# Patient Record
Sex: Male | Born: 1970 | Race: Black or African American | Hispanic: No | Smoking: Current every day smoker
Health system: Southern US, Community
[De-identification: ages and names within clinical notes are randomized; demographics above are authoritative.]

## PROBLEM LIST (undated history)

## (undated) DIAGNOSIS — F32A Depression, unspecified: Secondary | ICD-10-CM

## (undated) DIAGNOSIS — I10 Essential (primary) hypertension: Secondary | ICD-10-CM

## (undated) DIAGNOSIS — F419 Anxiety disorder, unspecified: Secondary | ICD-10-CM

## (undated) HISTORY — PX: FRACTURE SURGERY: SHX138

---

## 2003-08-12 ENCOUNTER — Emergency Department (HOSPITAL_COMMUNITY): Admission: EM | Admit: 2003-08-12 | Discharge: 2003-08-12 | Payer: Self-pay | Admitting: Emergency Medicine

## 2003-12-04 ENCOUNTER — Emergency Department (HOSPITAL_COMMUNITY): Admission: EM | Admit: 2003-12-04 | Discharge: 2003-12-05 | Payer: Self-pay | Admitting: Emergency Medicine

## 2003-12-06 ENCOUNTER — Emergency Department (HOSPITAL_COMMUNITY): Admission: EM | Admit: 2003-12-06 | Discharge: 2003-12-06 | Payer: Self-pay | Admitting: Family Medicine

## 2004-03-28 ENCOUNTER — Emergency Department (HOSPITAL_COMMUNITY): Admission: EM | Admit: 2004-03-28 | Discharge: 2004-03-28 | Payer: Self-pay | Admitting: Family Medicine

## 2004-11-08 ENCOUNTER — Emergency Department (HOSPITAL_COMMUNITY): Admission: EM | Admit: 2004-11-08 | Discharge: 2004-11-08 | Payer: Self-pay | Admitting: Emergency Medicine

## 2004-11-13 ENCOUNTER — Emergency Department: Payer: Self-pay | Admitting: Emergency Medicine

## 2005-10-04 ENCOUNTER — Emergency Department (HOSPITAL_COMMUNITY): Admission: EM | Admit: 2005-10-04 | Discharge: 2005-10-04 | Payer: Self-pay | Admitting: Family Medicine

## 2005-12-14 ENCOUNTER — Emergency Department (HOSPITAL_COMMUNITY): Admission: EM | Admit: 2005-12-14 | Discharge: 2005-12-14 | Payer: Self-pay | Admitting: Emergency Medicine

## 2005-12-22 ENCOUNTER — Emergency Department (HOSPITAL_COMMUNITY): Admission: EM | Admit: 2005-12-22 | Discharge: 2005-12-22 | Payer: Self-pay | Admitting: Emergency Medicine

## 2006-01-02 ENCOUNTER — Ambulatory Visit: Payer: Self-pay | Admitting: Internal Medicine

## 2006-01-16 ENCOUNTER — Ambulatory Visit: Payer: Self-pay | Admitting: Internal Medicine

## 2006-02-28 ENCOUNTER — Ambulatory Visit: Payer: Self-pay | Admitting: Internal Medicine

## 2008-07-09 ENCOUNTER — Emergency Department (HOSPITAL_COMMUNITY): Admission: EM | Admit: 2008-07-09 | Discharge: 2008-07-09 | Payer: Self-pay | Admitting: Family Medicine

## 2008-10-27 ENCOUNTER — Emergency Department (HOSPITAL_COMMUNITY): Admission: EM | Admit: 2008-10-27 | Discharge: 2008-10-27 | Payer: Self-pay | Admitting: Emergency Medicine

## 2009-02-01 ENCOUNTER — Emergency Department (HOSPITAL_COMMUNITY): Admission: EM | Admit: 2009-02-01 | Discharge: 2009-02-01 | Payer: Self-pay | Admitting: Emergency Medicine

## 2010-04-12 LAB — GC/CHLAMYDIA PROBE AMP, GENITAL
Chlamydia, DNA Probe: NEGATIVE
GC Probe Amp, Genital: NEGATIVE

## 2020-04-06 ENCOUNTER — Emergency Department
Admission: EM | Admit: 2020-04-06 | Discharge: 2020-04-06 | Disposition: A | Discharge status: Home / Self Care | Payer: Self-pay | Attending: Emergency Medicine | Admitting: Emergency Medicine

## 2020-04-06 ENCOUNTER — Emergency Department: Payer: Self-pay

## 2020-04-06 ENCOUNTER — Other Ambulatory Visit: Payer: Self-pay

## 2020-04-06 ENCOUNTER — Encounter: Payer: Self-pay | Admitting: Emergency Medicine

## 2020-04-06 DIAGNOSIS — R42 Dizziness and giddiness: Secondary | ICD-10-CM | POA: Insufficient documentation

## 2020-04-06 DIAGNOSIS — F1910 Other psychoactive substance abuse, uncomplicated: Secondary | ICD-10-CM | POA: Insufficient documentation

## 2020-04-06 DIAGNOSIS — R55 Syncope and collapse: Secondary | ICD-10-CM | POA: Insufficient documentation

## 2020-04-06 DIAGNOSIS — F17210 Nicotine dependence, cigarettes, uncomplicated: Secondary | ICD-10-CM | POA: Insufficient documentation

## 2020-04-06 LAB — URINALYSIS, COMPLETE (UACMP) WITH MICROSCOPIC
Bacteria, UA: NONE SEEN
Bilirubin Urine: NEGATIVE
Glucose, UA: NEGATIVE mg/dL
Ketones, ur: 5 mg/dL — AB
Leukocytes,Ua: NEGATIVE
Nitrite: NEGATIVE
Protein, ur: NEGATIVE mg/dL
Specific Gravity, Urine: 1.009 (ref 1.005–1.030)
Squamous Epithelial / LPF: NONE SEEN (ref 0–5)
pH: 7 (ref 5.0–8.0)

## 2020-04-06 LAB — URINE DRUG SCREEN, QUALITATIVE (ARMC ONLY)
Amphetamines, Ur Screen: NOT DETECTED
Barbiturates, Ur Screen: NOT DETECTED
Benzodiazepine, Ur Scrn: NOT DETECTED
Cannabinoid 50 Ng, Ur ~~LOC~~: POSITIVE — AB
Cocaine Metabolite,Ur ~~LOC~~: POSITIVE — AB
MDMA (Ecstasy)Ur Screen: NOT DETECTED
Methadone Scn, Ur: NOT DETECTED
Opiate, Ur Screen: NOT DETECTED
Phencyclidine (PCP) Ur S: NOT DETECTED
Tricyclic, Ur Screen: NOT DETECTED

## 2020-04-06 LAB — TROPONIN I (HIGH SENSITIVITY): Troponin I (High Sensitivity): 4 ng/L (ref <18)

## 2020-04-06 LAB — CBC
HCT: 39.8 % (ref 39.0–52.0)
Hemoglobin: 13.7 g/dL (ref 13.0–17.0)
MCH: 30.4 pg (ref 26.0–34.0)
MCHC: 34.4 g/dL (ref 30.0–36.0)
MCV: 88.4 fL (ref 80.0–100.0)
Platelets: 348 10*3/uL (ref 150–400)
RBC: 4.5 MIL/uL (ref 4.22–5.81)
RDW: 13.5 % (ref 11.5–15.5)
WBC: 9 10*3/uL (ref 4.0–10.5)
nRBC: 0 % (ref 0.0–0.2)

## 2020-04-06 LAB — ETHANOL: Alcohol, Ethyl (B): <10 mg/dL (ref <10)

## 2020-04-06 LAB — BASIC METABOLIC PANEL
Anion gap: 12 (ref 5–15)
BUN: 14 mg/dL (ref 6–20)
CO2: 23 mmol/L (ref 22–32)
Calcium: 9.3 mg/dL (ref 8.9–10.3)
Chloride: 98 mmol/L (ref 98–111)
Creatinine, Ser: 1.08 mg/dL (ref 0.61–1.24)
GFR, Estimated: >60 mL/min (ref >60)
Glucose, Bld: 110 mg/dL — ABNORMAL HIGH (ref 70–99)
Potassium: 3.2 mmol/L — ABNORMAL LOW (ref 3.5–5.1)
Sodium: 133 mmol/L — ABNORMAL LOW (ref 135–145)

## 2020-04-06 LAB — CBG MONITORING, ED: Glucose-Capillary: 109 mg/dL — ABNORMAL HIGH (ref 70–99)

## 2020-04-06 MED ORDER — LACTATED RINGERS IV BOLUS
1000.0000 mL | Freq: Once | INTRAVENOUS | Status: AC
  Administered 2020-04-06: 1000 mL via INTRAVENOUS

## 2020-04-06 MED ORDER — POTASSIUM CHLORIDE CRYS ER 20 MEQ PO TBCR
40.0000 meq | EXTENDED_RELEASE_TABLET | Freq: Once | ORAL | Status: AC
  Administered 2020-04-06: 40 meq via ORAL
  Filled 2020-04-06: qty 2

## 2020-04-06 NOTE — ED Notes (Signed)
Pt ambulatory to bathroom with his son at his side

## 2020-04-06 NOTE — ED Notes (Signed)
MD at bedside for eval.

## 2020-04-06 NOTE — ED Provider Notes (Signed)
[facility] Emergency Department Provider Note   ____________________________________________   Event Date/Time   First MD Initiated Contact with Patient 04/06/20 1559     (approximate)  I have reviewed the triage vital signs and the nursing notes.   HISTORY  Chief Complaint Syncope   HPI Miguel Meadows is a 50 y.o. male with no significant past medical history who presents to the ED complaining of syncope.  Patient reports that he passed out 3 times back to back while at his friend's house.  He states he was feeling very lightheaded and then eventually fell to the ground, losing consciousness for a few seconds.  He is concerned that he hit his head as he now complains of headache and pain in his neck.  He denies any pain in his extremities, chest, or abdomen.  He states he has never had syncopal episodes like this before, denies any chest pain or shortness of breath with these episodes.  He had been feeling well recently with no fevers, cough, vomiting, diarrhea.  He does admit to significant alcohol and drug use in the past month and a half since his wife passed away.  He states he has been drinking 3-4 beers along with liquor on a daily basis with his last drink coming earlier today.  He also admits to marijuana and crack cocaine use, denies opiate use or IV drug use.  He denies any suicidal or homicidal ideation.        History reviewed. No pertinent past medical history.  There are no problems to display for this patient.   History reviewed. No pertinent surgical history.  Prior to Admission medications   Not on File    Allergies Patient has no allergy information on record.  History reviewed. No pertinent family history.  Social History Social History   Tobacco Use  . Smoking status: Current Every Day Smoker    Packs/day: 0.50    Types: Cigarettes    Review of Systems  Constitutional: No fever/chills Eyes: No visual  changes. ENT: No sore throat. Cardiovascular: Denies chest pain.  Positive for syncope. Respiratory: Denies shortness of breath. Gastrointestinal: No abdominal pain.  No nausea, no vomiting.  No diarrhea.  No constipation. Genitourinary: Negative for dysuria. Musculoskeletal: Negative for back pain.  Positive for neck pain. Skin: Negative for rash. Neurological: Positive for headaches, negative for focal weakness or numbness.  ____________________________________________   PHYSICAL EXAM:  VITAL SIGNS: ED Triage Vitals  Enc Vitals Group     BP 04/06/20 1526 131/77     Pulse Rate 04/06/20 1526 85     Resp 04/06/20 1526 19     Temp 04/06/20 1535 97.8 F (36.6 C)     Temp Source 04/06/20 1535 Oral     SpO2 04/06/20 1526 99 %     Weight 04/06/20 1536 170 lb (77.1 kg)     Height 04/06/20 1536 5\' 7"  (1.702 m)     Head Circumference --      Peak Flow --      Pain Score 04/06/20 1536 4     Pain Loc --      Pain Edu? --      Excl. in GC? --     Constitutional: Somnolent but arousable to voice, oriented to person, place, time, and situation.  Intoxicated appearing. Eyes: Conjunctivae are normal.  Pupils equal round and reactive to light bilaterally. Head: Atraumatic.  No scalp hematomas or step-offs. Nose: No congestion/rhinnorhea. Mouth/Throat: Mucous membranes  are moist. Neck: Normal ROM, midline cervical spine tenderness to palpation noted. Cardiovascular: Normal rate, regular rhythm. Grossly normal heart sounds. Respiratory: Normal respiratory effort.  No retractions. Lungs CTAB. Gastrointestinal: Soft and nontender. No distention. Genitourinary: deferred Musculoskeletal: No lower extremity tenderness nor edema. Neurologic:  Normal speech and language. No gross focal neurologic deficits are appreciated. Skin:  Skin is warm, dry and intact. No rash noted. Psychiatric: Mood and affect are normal. Speech and behavior are normal.  ____________________________________________    LABS (all labs ordered are listed, but only abnormal results are displayed)  Labs Reviewed  BASIC METABOLIC PANEL - Abnormal; Notable for the following components:      Result Value   Sodium 133 (*)    Potassium 3.2 (*)    Glucose, Bld 110 (*)    All other components within normal limits  URINALYSIS, COMPLETE (UACMP) WITH MICROSCOPIC - Abnormal; Notable for the following components:   Color, Urine YELLOW (*)    APPearance CLEAR (*)    Hgb urine dipstick SMALL (*)    Ketones, ur 5 (*)    All other components within normal limits  URINE DRUG SCREEN, QUALITATIVE ([facility] ONLY) - Abnormal; Notable for the following components:   Cocaine Metabolite,Ur [place] POSITIVE (*)    Cannabinoid 50 Ng, Ur  POSITIVE (*)    All other components within normal limits  CBG MONITORING, ED - Abnormal; Notable for the following components:   Glucose-Capillary 109 (*)    All other components within normal limits  CBC  ETHANOL  TROPONIN I (HIGH SENSITIVITY)   ____________________________________________  EKG  ED ECG REPORT I, Chesley Noon, the attending physician, personally viewed and interpreted this ECG.   Date: 04/06/2020  EKG Time: 15:52  Rate: 74  Rhythm: normal sinus rhythm, sinus arrhythmia  Axis: Normal  Intervals:none  ST&T Change: None   PROCEDURES  Procedure(s) performed (including Critical Care):  Procedures   ____________________________________________   INITIAL IMPRESSION / ASSESSMENT AND PLAN / ED COURSE       50 year old male with no significant past medical history presents to the ED after reporting 3 syncopal episodes in rapid succession at his friend's house today.  EKG shows no evidence of arrhythmia or ischemia and I have a low suspicion for cardiac etiology as patient denies any associated chest pain or shortness of breath.  We will observe on cardiac monitor and screening labs including troponin.  His recent binge drinking may be a factor and he has likely had  poor p.o. intake.  We will hydrate with IV fluids.  There was concerned that patient was having respiratory depression, although he currently has a normal respiratory rate with O2 sats in the mid 90s on room air.  He denies any opiate use and we will hold off on Narcan for now.  We will further assess for traumatic injury with CT head and C-spine.  CT head reviewed by me and shows no obvious hemorrhage, CT head and C-spine negative for acute process per radiology.  Labs are unremarkable, troponin within normal limits and I doubt cardiac etiology of his syncope.  Patient feeling much better following IV fluid hydration, son is now at bedside requesting information on detox.  Patient continues to decline inpatient detox, but is requesting outpatient resources.  He is appropriate for discharge home and was provided with referral to PCP for follow-up of syncopal episode, also provided with resources for substance abuse.  He was counseled to return to the ED for new or worsening  symptoms, patient agrees with plan.      ____________________________________________   FINAL CLINICAL IMPRESSION(S) / ED DIAGNOSES  Final diagnoses:  Syncope, unspecified syncope type  Polysubstance abuse [facility])     ED Discharge Orders    None       Note:  This document was prepared using Dragon voice recognition software and may include unintentional dictation errors.   Chesley Noon, MD 04/06/20 1911

## 2020-04-06 NOTE — ED Notes (Signed)
Patient transported to CT 

## 2020-04-06 NOTE — ED Notes (Signed)
Pt very drowsy in triage, pt responds to verbal stimulation, pt with sats of 96 on room air. Pt states he did crack and marijuana at 1 pm, today.

## 2020-04-06 NOTE — ED Triage Notes (Signed)
Pt comes into the ED via [facility] from a friends house for syncopal episode.  Pt does admit to his wife recently passed away and now he has been on a 1 month binge of crack, marijuana, and alcohol. Pt denies hitting his head, but did have episode of incontinence.  VSS with EMS. Pt A&Ox4. 18g Left forearm.

## 2020-04-06 NOTE — ED Notes (Signed)
Pt with irregular RR, sats sometimes decreasing to 88 % on room air very shortly, then increasing to 98-100% on room air.

## 2020-04-13 ENCOUNTER — Telehealth: Payer: Self-pay

## 2020-04-13 NOTE — Telephone Encounter (Signed)
After receiving an ER referral, called pt on mobile and home numbers. Was unable to leave message.   MD 04/13/20 @ 2:25 pm

## 2020-09-16 ENCOUNTER — Encounter (HOSPITAL_COMMUNITY): Payer: Self-pay

## 2020-09-16 ENCOUNTER — Emergency Department (HOSPITAL_COMMUNITY)
Admission: EM | Admit: 2020-09-16 | Discharge: 2020-09-17 | Disposition: A | Discharge status: Home / Self Care | Payer: Self-pay | Attending: Emergency Medicine | Admitting: Emergency Medicine

## 2020-09-16 ENCOUNTER — Other Ambulatory Visit: Payer: Self-pay

## 2020-09-16 DIAGNOSIS — F1910 Other psychoactive substance abuse, uncomplicated: Secondary | ICD-10-CM

## 2020-09-16 DIAGNOSIS — R45851 Suicidal ideations: Secondary | ICD-10-CM | POA: Insufficient documentation

## 2020-09-16 DIAGNOSIS — F17210 Nicotine dependence, cigarettes, uncomplicated: Secondary | ICD-10-CM | POA: Insufficient documentation

## 2020-09-16 DIAGNOSIS — F1414 Cocaine abuse with cocaine-induced mood disorder: Secondary | ICD-10-CM | POA: Insufficient documentation

## 2020-09-16 DIAGNOSIS — F1924 Other psychoactive substance dependence with psychoactive substance-induced mood disorder: Secondary | ICD-10-CM | POA: Insufficient documentation

## 2020-09-16 DIAGNOSIS — Y906 Blood alcohol level of 120-199 mg/100 ml: Secondary | ICD-10-CM | POA: Insufficient documentation

## 2020-09-16 DIAGNOSIS — F1994 Other psychoactive substance use, unspecified with psychoactive substance-induced mood disorder: Secondary | ICD-10-CM | POA: Diagnosis present

## 2020-09-16 DIAGNOSIS — Z20822 Contact with and (suspected) exposure to covid-19: Secondary | ICD-10-CM | POA: Insufficient documentation

## 2020-09-16 DIAGNOSIS — F332 Major depressive disorder, recurrent severe without psychotic features: Secondary | ICD-10-CM | POA: Insufficient documentation

## 2020-09-16 LAB — COMPREHENSIVE METABOLIC PANEL
ALT: 16 U/L (ref 0–44)
AST: 20 U/L (ref 15–41)
Albumin: 3.9 g/dL (ref 3.5–5.0)
Alkaline Phosphatase: 44 U/L (ref 38–126)
Anion gap: 10 (ref 5–15)
BUN: 9 mg/dL (ref 6–20)
CO2: 23 mmol/L (ref 22–32)
Calcium: 9.4 mg/dL (ref 8.9–10.3)
Chloride: 107 mmol/L (ref 98–111)
Creatinine, Ser: 0.9 mg/dL (ref 0.61–1.24)
GFR, Estimated: >60 mL/min (ref >60)
Glucose, Bld: 105 mg/dL — ABNORMAL HIGH (ref 70–99)
Potassium: 4 mmol/L (ref 3.5–5.1)
Sodium: 140 mmol/L (ref 135–145)
Total Bilirubin: 0.7 mg/dL (ref 0.3–1.2)
Total Protein: 7.3 g/dL (ref 6.5–8.1)

## 2020-09-16 LAB — CBC WITH DIFFERENTIAL/PLATELET
Abs Immature Granulocytes: 0.02 10*3/uL (ref 0.00–0.07)
Basophils Absolute: 0 10*3/uL (ref 0.0–0.1)
Basophils Relative: 0 %
Eosinophils Absolute: 0.1 10*3/uL (ref 0.0–0.5)
Eosinophils Relative: 2 %
HCT: 45.3 % (ref 39.0–52.0)
Hemoglobin: 14.9 g/dL (ref 13.0–17.0)
Immature Granulocytes: 0 %
Lymphocytes Relative: 34 %
Lymphs Abs: 2.4 10*3/uL (ref 0.7–4.0)
MCH: 30.7 pg (ref 26.0–34.0)
MCHC: 32.9 g/dL (ref 30.0–36.0)
MCV: 93.2 fL (ref 80.0–100.0)
Monocytes Absolute: 0.6 10*3/uL (ref 0.1–1.0)
Monocytes Relative: 8 %
Neutro Abs: 4 10*3/uL (ref 1.7–7.7)
Neutrophils Relative %: 56 %
Platelets: 311 10*3/uL (ref 150–400)
RBC: 4.86 MIL/uL (ref 4.22–5.81)
RDW: 14.5 % (ref 11.5–15.5)
WBC: 7.1 10*3/uL (ref 4.0–10.5)
nRBC: 0 % (ref 0.0–0.2)

## 2020-09-16 LAB — RAPID URINE DRUG SCREEN, HOSP PERFORMED
Amphetamines: NOT DETECTED
Barbiturates: NOT DETECTED
Benzodiazepines: NOT DETECTED
Cocaine: POSITIVE — AB
Opiates: NOT DETECTED
Tetrahydrocannabinol: NOT DETECTED

## 2020-09-16 LAB — RESP PANEL BY RT-PCR (FLU A&B, COVID) ARPGX2
Influenza A by PCR: NEGATIVE
Influenza B by PCR: NEGATIVE
SARS Coronavirus 2 by RT PCR: NEGATIVE

## 2020-09-16 LAB — ACETAMINOPHEN LEVEL: Acetaminophen (Tylenol), Serum: <10 ug/mL — ABNORMAL LOW (ref 10–30)

## 2020-09-16 LAB — SALICYLATE LEVEL: Salicylate Lvl: <7 mg/dL — ABNORMAL LOW (ref 7.0–30.0)

## 2020-09-16 LAB — ETHANOL: Alcohol, Ethyl (B): 125 mg/dL — ABNORMAL HIGH (ref <10)

## 2020-09-16 MED ORDER — THIAMINE HCL 100 MG PO TABS
100.0000 mg | ORAL_TABLET | Freq: Every day | ORAL | Status: DC
  Administered 2020-09-16 – 2020-09-17 (×2): 100 mg via ORAL
  Filled 2020-09-16 (×2): qty 1

## 2020-09-16 MED ORDER — LORAZEPAM 1 MG PO TABS
0.0000 mg | ORAL_TABLET | Freq: Four times a day (QID) | ORAL | Status: DC
  Administered 2020-09-16 – 2020-09-17 (×2): 1 mg via ORAL
  Administered 2020-09-17: 2 mg via ORAL
  Filled 2020-09-16 (×2): qty 1
  Filled 2020-09-16: qty 2

## 2020-09-16 MED ORDER — LORAZEPAM 1 MG PO TABS: 0.0000 mg | ORAL_TABLET | Freq: Two times a day (BID) | ORAL | Status: DC

## 2020-09-16 MED ORDER — LORAZEPAM 2 MG/ML IJ SOLN
0.0000 mg | Freq: Four times a day (QID) | INTRAMUSCULAR | Status: DC
Start: 2020-09-16 — End: 2020-09-17

## 2020-09-16 MED ORDER — LORAZEPAM 2 MG/ML IJ SOLN: 0.0000 mg | Freq: Two times a day (BID) | INTRAMUSCULAR | Status: DC

## 2020-09-16 MED ORDER — THIAMINE HCL 100 MG/ML IJ SOLN: 100.0000 mg | Freq: Every day | INTRAMUSCULAR | Status: DC

## 2020-09-16 MED ORDER — NICOTINE 21 MG/24HR TD PT24
21.0000 mg | MEDICATED_PATCH | Freq: Every day | TRANSDERMAL | Status: DC | PRN
Start: 2020-09-16 — End: 2020-09-17

## 2020-09-16 NOTE — ED Notes (Addendum)
Pt belonging have been placed in [facility].

## 2020-09-16 NOTE — ED Provider Notes (Signed)
[facility]-EMERGENCY DEPT Provider Note   CSN: [id number] Arrival date & time: 09/16/20  1542     History Chief Complaint  Patient presents with   Suicidal   Addiction Problem    Miguel Meadows is a 50 y.o. male with a past medical history significant for depression who presents to the ED due to SI.  Patient states he had thoughts of killing himself last night where he planned to overdose.  Patient states his wife recently passed away 6 months ago and has been having depressive thoughts ever since.  Patient admits to HI where he states he wants to hurt people that have hurt him.  Notes he does not want hurt anyone here in the hospital.  He admits to drinking alcohol daily.  He states he shares a half a gallon with a few people daily.  Drank alcohol prior to arrival.  He also admits to methamphetamines, opioids, and crack use.  He mitts to some IV drug use.  Last used 1 month ago.  Denies any hallucinations.  He notes he sometimes has hallucinations when he is "high".  No physical complaints.  No treatment prior to arrival.  History obtained from patient and past medical records. No interpreter used during encounter.       History reviewed. No pertinent past medical history.  There are no problems to display for this patient.   History reviewed. No pertinent surgical history.     History reviewed. No pertinent family history.  Social History   Tobacco Use   Smoking status: Every Day    Packs/day: 0.50    Types: Cigarettes  Substance Use Topics   Alcohol use: Yes   Drug use: Yes    Types: IV, Cocaine, Marijuana, Methamphetamines    Home Medications Prior to Admission medications   Not on File    Allergies    Patient has no known allergies.  Review of Systems   Review of Systems  Constitutional:  Negative for chills and fever.  HENT:  Negative for rhinorrhea and sore throat.   Eyes:  Negative for visual disturbance.  Respiratory:   Negative for shortness of breath.   Cardiovascular:  Negative for chest pain and palpitations.  Gastrointestinal:  Negative for abdominal pain.  Genitourinary:  Negative for dysuria.  Musculoskeletal:  Negative for myalgias.  Skin:  Negative for color change and rash.  Neurological:  Negative for dizziness and light-headedness.  Psychiatric/Behavioral:  Positive for self-injury and suicidal ideas.    Physical Exam Updated Vital Signs BP 119/89 (BP Location: Left Arm)   Pulse 93   Temp 98.7 F (37.1 C) (Oral)   Resp 19   SpO2 98%   Physical Exam Vitals and nursing note reviewed.  Constitutional:      General: He is not in acute distress.    Appearance: He is not ill-appearing.  HENT:     Head: Normocephalic.  Eyes:     Pupils: Pupils are equal, round, and reactive to light.  Cardiovascular:     Rate and Rhythm: Normal rate and regular rhythm.     Pulses: Normal pulses.     Heart sounds: Normal heart sounds. No murmur heard.   No friction rub. No gallop.  Pulmonary:     Effort: Pulmonary effort is normal.     Breath sounds: Normal breath sounds.  Abdominal:     General: Abdomen is flat. There is no distension.     Palpations: Abdomen is soft.  Tenderness: There is no abdominal tenderness. There is no guarding or rebound.  Musculoskeletal:        General: Normal range of motion.     Cervical back: Neck supple.  Skin:    General: Skin is warm and dry.  Neurological:     General: No focal deficit present.     Mental Status: He is alert.  Psychiatric:        Mood and Affect: Mood is depressed.        Behavior: Behavior normal.        Thought Content: Thought content includes suicidal ideation. Thought content includes suicidal plan.    ED Results / Procedures / Treatments   Labs (all labs ordered are listed, but only abnormal results are displayed) Labs Reviewed  COMPREHENSIVE METABOLIC PANEL - Abnormal; Notable for the following components:      Result Value    Glucose, Bld 105 (*)    All other components within normal limits  ETHANOL - Abnormal; Notable for the following components:   Alcohol, Ethyl (B) 125 (*)    All other components within normal limits  RAPID URINE DRUG SCREEN, HOSP PERFORMED - Abnormal; Notable for the following components:   Cocaine POSITIVE (*)    All other components within normal limits  ACETAMINOPHEN LEVEL - Abnormal; Notable for the following components:   Acetaminophen (Tylenol), Serum <10 (*)    All other components within normal limits  SALICYLATE LEVEL - Abnormal; Notable for the following components:   Salicylate Lvl <7.0 (*)    All other components within normal limits  RESP PANEL BY RT-PCR (FLU A&B, COVID) ARPGX2  CBC WITH DIFFERENTIAL/PLATELET    EKG None  Radiology No results found.  Procedures Procedures   Medications Ordered in ED Medications  LORazepam (ATIVAN) injection 0-4 mg ( Intravenous See Alternative 09/16/20 1709)    Or  LORazepam (ATIVAN) tablet 0-4 mg (1 mg Oral Given 09/16/20 1709)  LORazepam (ATIVAN) injection 0-4 mg (has no administration in time range)    Or  LORazepam (ATIVAN) tablet 0-4 mg (has no administration in time range)  thiamine tablet 100 mg (100 mg Oral Given 09/16/20 1710)    Or  thiamine (B-1) injection 100 mg ( Intravenous See Alternative 09/16/20 1710)  nicotine (NICODERM CQ - dosed in mg/24 hours) patch 21 mg (has no administration in time range)    ED Course  I have reviewed the triage vital signs and the nursing notes.  Pertinent labs & imaging results that were available during my care of the patient were reviewed by me and considered in my medical decision making (see chart for details).  Clinical Course as of 09/16/20 1816  Sat Sep 16, 2020  1813 Alcohol, Ethyl (B)(!): 125 [CA]  1813 COCAINE(!): POSITIVE [CA]    Clinical Course User Index [CA] Audelia Acton Raina Mina   MDM Rules/Calculators/A&P                           50 year old male  presents to the ED due to suicidal ideations.  He had plans to overdose last night on his sister's pills.  Patient's wife recently passed away 6 months ago.  History of depression.  Upon arrival, vitals all within normal limits.  Patient is afebrile, not tachycardic or hypoxic.  Patient in no acute distress.  Benign physical exam.  Medical clearance labs ordered at triage.  CIWA in place.  No signs of withdrawal at this time.  Patient is currently here voluntarily; however, will need to be IVC'd if he attempts to leave.  CBC unremarkable no leukocytosis and normal hemoglobin.  Acetaminophen and salicylate level within normal limits.  Ethanol elevated at 125.  CMP reassuring with normal renal function no major electrolyte derangements.  COVID/influenza negative.  UDS positive for cocaine.  Patient has been medically cleared for TTS evaluation.  The patient has been placed in psychiatric observation due to the need to provide a safe environment for the patient while obtaining psychiatric consultation and evaluation, as well as ongoing medical and medication management to treat the patient's condition.  The patient has not been placed under full IVC at this time.  Final Clinical Impression(s) / ED Diagnoses Final diagnoses:  Suicidal ideation  Polysubstance abuse [facility])    Rx / DC Orders ED Discharge Orders     None        Mannie Stabile, PA-C 09/16/20 1816    Charlynne Pander, MD 09/16/20 [phone]

## 2020-09-16 NOTE — ED Notes (Addendum)
Pt brought with him a book bag with two pair of shoes, a duffle bag with drinks, snacks, several articles of clothes, a bottle of lotion. On his body he had a red hat, shirt, shorts, a pair of socks and a pair of shoe. Items were collected and place in white bags.

## 2020-09-16 NOTE — ED Triage Notes (Signed)
Pt reports he wants help with his drug use. Pt reports using crack, marijuana, heroin, and opiods. Pt also reports thoughts of suicide. States that he thought about overdosing on his sister's pills last night.

## 2020-09-16 NOTE — ED Notes (Signed)
Per Berna Spare:  Recommended for psychiatric inpatient tx per NP Ene Ajibola.
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
## 2020-09-16 NOTE — ED Provider Notes (Signed)
Emergency Medicine Provider Triage Evaluation Note  Miguel Meadows , a 50 y.o. male  was evaluated in triage.  Pt complains of requesting help for SI. He has been mostly using methamphetamines, opioids, crack. He drinks every day.  He wanted to take his sisters meds to kill him self last night.  He states he "is a coward."  His wife died about 6 months ago and he had been clean before that.  Since he states that his wife passed he has tried to kill him self slowly and that he hasn't wanted to live.   He also states "I don't want to be here and there are a few people I want to take with me."   He reports that he has access to a gun, he feels like people in his life have been trying to deliberately hurt him and if he is can go out he wants to make them feel pain before he leaves.  He states that he does not necessarily want to kill them, just wants to make him suffer.  He drinks alcohol every day.  He states he had alcohol prior to coming in today.  He did not tell me the names of any one in particular he wants to harm or give any clues to their identity.   Review of Systems  Positive: SI, HI, Depression Negative: Fevers  Physical Exam  BP 119/89 (BP Location: Left Arm)   Pulse 93   Temp 98.7 F (37.1 C) (Oral)   Resp 19   SpO2 98%  Gen:   Awake, no distress   Resp:  Normal effort  MSK:   Moves extremities without difficulty  Other:  Patient endorses SI, HI, feelings of being worthless and helpless.  Admit to IV drug use.  Medical Decision Making  Medically screening exam initiated at 4:15 PM.  Appropriate orders placed.  HOYLE BARKDULL was informed that the remainder of the evaluation will be completed by another provider, this initial triage assessment does not replace that evaluation, and the importance of remaining in the ED until their evaluation is complete.  Patient is currently voluntary however if he attempts to leave I would strongly recommend IVC.   Note: Portions of  this report may have been transcribed using voice recognition software. Every effort was made to ensure accuracy; however, inadvertent computerized transcription errors may be present    Norman Clay 09/16/20 1630    Kommor, Wyn Forster, MD 09/16/20 2250

## 2020-09-17 ENCOUNTER — Emergency Department (HOSPITAL_COMMUNITY): Admission: EM | Admit: 2020-09-17 | Discharge: 2020-09-17 | Discharge status: Against Medical Advice | Payer: Self-pay

## 2020-09-17 ENCOUNTER — Encounter: Payer: Self-pay | Admitting: Emergency Medicine

## 2020-09-17 ENCOUNTER — Emergency Department
Admission: EM | Admit: 2020-09-17 | Discharge: 2020-09-18 | Disposition: A | Discharge status: Home / Self Care | Payer: Self-pay | Attending: Emergency Medicine | Admitting: Emergency Medicine

## 2020-09-17 ENCOUNTER — Other Ambulatory Visit: Payer: Self-pay

## 2020-09-17 DIAGNOSIS — F1414 Cocaine abuse with cocaine-induced mood disorder: Secondary | ICD-10-CM | POA: Diagnosis present

## 2020-09-17 DIAGNOSIS — F1994 Other psychoactive substance use, unspecified with psychoactive substance-induced mood disorder: Secondary | ICD-10-CM | POA: Diagnosis present

## 2020-09-17 DIAGNOSIS — F419 Anxiety disorder, unspecified: Secondary | ICD-10-CM | POA: Insufficient documentation

## 2020-09-17 DIAGNOSIS — F32A Depression, unspecified: Secondary | ICD-10-CM

## 2020-09-17 DIAGNOSIS — F1910 Other psychoactive substance abuse, uncomplicated: Secondary | ICD-10-CM | POA: Diagnosis present

## 2020-09-17 DIAGNOSIS — I10 Essential (primary) hypertension: Secondary | ICD-10-CM | POA: Insufficient documentation

## 2020-09-17 DIAGNOSIS — F17210 Nicotine dependence, cigarettes, uncomplicated: Secondary | ICD-10-CM | POA: Insufficient documentation

## 2020-09-17 DIAGNOSIS — F1494 Cocaine use, unspecified with cocaine-induced mood disorder: Secondary | ICD-10-CM | POA: Insufficient documentation

## 2020-09-17 DIAGNOSIS — R45851 Suicidal ideations: Secondary | ICD-10-CM | POA: Insufficient documentation

## 2020-09-17 HISTORY — DX: Depression, unspecified: F32.A

## 2020-09-17 HISTORY — DX: Essential (primary) hypertension: I10

## 2020-09-17 HISTORY — DX: Anxiety disorder, unspecified: F41.9

## 2020-09-17 LAB — CBC
HCT: 41.8 % (ref 39.0–52.0)
Hemoglobin: 14.5 g/dL (ref 13.0–17.0)
MCH: 32.2 pg (ref 26.0–34.0)
MCHC: 34.7 g/dL (ref 30.0–36.0)
MCV: 92.9 fL (ref 80.0–100.0)
Platelets: 311 10*3/uL (ref 150–400)
RBC: 4.5 MIL/uL (ref 4.22–5.81)
RDW: 14.4 % (ref 11.5–15.5)
WBC: 7.1 10*3/uL (ref 4.0–10.5)
nRBC: 0 % (ref 0.0–0.2)

## 2020-09-17 LAB — COMPREHENSIVE METABOLIC PANEL
ALT: 19 U/L (ref 0–44)
AST: 22 U/L (ref 15–41)
Albumin: 3.6 g/dL (ref 3.5–5.0)
Alkaline Phosphatase: 41 U/L (ref 38–126)
Anion gap: 8 (ref 5–15)
BUN: 14 mg/dL (ref 6–20)
CO2: 27 mmol/L (ref 22–32)
Calcium: 9.4 mg/dL (ref 8.9–10.3)
Chloride: 102 mmol/L (ref 98–111)
Creatinine, Ser: 1.39 mg/dL — ABNORMAL HIGH (ref 0.61–1.24)
GFR, Estimated: >60 mL/min (ref >60)
Glucose, Bld: 88 mg/dL (ref 70–99)
Potassium: 4.5 mmol/L (ref 3.5–5.1)
Sodium: 137 mmol/L (ref 135–145)
Total Bilirubin: 0.5 mg/dL (ref 0.3–1.2)
Total Protein: 6.9 g/dL (ref 6.5–8.1)

## 2020-09-17 LAB — ETHANOL: Alcohol, Ethyl (B): <10 mg/dL (ref <10)

## 2020-09-17 MED ORDER — THIAMINE HCL 100 MG/ML IJ SOLN: 100.0000 mg | Freq: Every day | INTRAMUSCULAR | Status: DC

## 2020-09-17 MED ORDER — LORAZEPAM 2 MG/ML IJ SOLN: 0.0000 mg | Freq: Two times a day (BID) | INTRAMUSCULAR | Status: DC

## 2020-09-17 MED ORDER — LORAZEPAM 1 MG PO TABS
1.0000 mg | ORAL_TABLET | Freq: Once | ORAL | Status: AC
  Administered 2020-09-17: 1 mg via ORAL
  Filled 2020-09-17: qty 1

## 2020-09-17 MED ORDER — LORAZEPAM 2 MG/ML IJ SOLN: 0.0000 mg | Freq: Four times a day (QID) | INTRAMUSCULAR | Status: DC

## 2020-09-17 MED ORDER — LORAZEPAM 2 MG PO TABS: 0.0000 mg | ORAL_TABLET | Freq: Two times a day (BID) | ORAL | Status: DC

## 2020-09-17 MED ORDER — THIAMINE HCL 100 MG PO TABS: 100.0000 mg | ORAL_TABLET | Freq: Every day | ORAL | Status: DC

## 2020-09-17 MED ORDER — LORAZEPAM 2 MG PO TABS: 0.0000 mg | ORAL_TABLET | Freq: Four times a day (QID) | ORAL | Status: DC

## 2020-09-17 MED ORDER — GABAPENTIN 300 MG PO CAPS
300.0000 mg | ORAL_CAPSULE | Freq: Three times a day (TID) | ORAL | Status: DC
  Administered 2020-09-17: 300 mg via ORAL
  Filled 2020-09-17: qty 1

## 2020-09-17 NOTE — ED Provider Notes (Signed)
[facility] Emergency Department Provider Note   ____________________________________________    I have reviewed the triage vital signs and the nursing notes.   HISTORY  Chief Complaint Psychiatric Evaluation     HPI Miguel Meadows is a 49 y.o. male who presents with complaints of anxiety and depression and occasional suicidal ideation since his wife passed away 6 months ago.  Does admit to probably substance abuse.  Has not tried to harm himself.  Review of medical records demonstrates the patient was seen this morning at [facility], evaluated by psychiatry there and cleared.  He presents to our hospital today stating that he still feels the same  Past Medical History:  Diagnosis Date   Anxiety    Depression    Hypertension     Patient Active Problem List   Diagnosis Date Noted   Cocaine abuse with cocaine-induced mood disorder (HCC) 09/17/2020   Polysubstance abuse (HCC) 09/17/2020   Substance induced mood disorder (HCC) 09/17/2020    Past Surgical History:  Procedure Laterality Date   FRACTURE SURGERY      Prior to Admission medications   Not on File     Allergies Patient has no known allergies.  History reviewed. No pertinent family history.  Social History Social History   Tobacco Use   Smoking status: Every Day    Packs/day: 0.50    Types: Cigarettes   Smokeless tobacco: Never  Substance Use Topics   Alcohol use: Yes   Drug use: Yes    Types: IV, Cocaine, Marijuana, Methamphetamines    Review of Systems  Constitutional: No fever/chills Eyes: No visual changes.  ENT: No sore throat. Cardiovascular: Denies chest pain. Respiratory: Denies shortness of breath. Gastrointestinal: No abdominal pain.  .   Genitourinary: Negative for dysuria. Musculoskeletal: Negative for back pain. Skin: Negative for rash. Neurological: Negative for headaches or  weakness   ____________________________________________   PHYSICAL EXAM:  VITAL SIGNS: ED Triage Vitals [09/17/20 1613]  Enc Vitals Group     BP (!) 128/111     Pulse Rate 90     Resp 20     Temp 98.6 F (37 C)     Temp Source Oral     SpO2 98 %     Weight 68 kg (150 lb)     Height 1.676 m (5\' 6" )     Head Circumference      Peak Flow      Pain Score 0     Pain Loc      Pain Edu?      Excl. in GC?     Constitutional: Alert and oriented.  Eyes: Conjunctivae are normal.   Nose: No congestion/rhinnorhea. Mouth/Throat: Mucous membranes are moist.    Cardiovascular: Normal rate, regular rhythm. Grossly normal heart sounds.  Good peripheral circulation. Respiratory: Normal respiratory effort.  No retractions. Lungs CTAB. Gastrointestinal: Soft and nontender. No distention.  No CVA tenderness.  Musculoskeletal: No lower extremity tenderness nor edema.  Warm and well perfused Neurologic:  Normal speech and language. No gross focal neurologic deficits are appreciated.  Skin:  Skin is warm, dry and intact. No rash noted. Psychiatric: Mood and affect are normal. Speech and behavior are normal.  ____________________________________________   LABS (all labs ordered are listed, but only abnormal results are displayed)  Labs Reviewed - No data to display ____________________________________________  EKG  None ____________________________________________  RADIOLOGY  None ____________________________________________   PROCEDURES  Procedure(s) performed: No  Procedures  Critical Care performed: No ____________________________________________   INITIAL IMPRESSION / ASSESSMENT AND PLAN / ED COURSE  Pertinent labs & imaging results that were available during my care of the patient were reviewed by me and considered in my medical decision making (see chart for details).   Patient overall well-appearing and in no acute distress.  Psychiatrically cleared earlier  today, he was voluntary.  Have consulted TTS and psychiatry.  Reassuring vitals and exam.  The patient is medically cleared for psychiatric evaluation.  Does not need repeat labs which were drawn yesterday   Patient seen by psychiatry team, they will attempt placement at [facility] which is what he is requesting.   The patient has been placed in psychiatric observation due to the need to provide a safe environment for the patient while obtaining psychiatric consultation and evaluation, as well as ongoing medical and medication management to treat the patient's condition.  The patient has not been placed under full IVC at this time.       ____________________________________________   FINAL CLINICAL IMPRESSION(S) / ED DIAGNOSES  Final diagnoses:  Depression, unspecified depression type        Note:  This document was prepared using Dragon voice recognition software and may include unintentional dictation errors.    Jene Every, MD 09/17/20 1950

## 2020-09-17 NOTE — BH Assessment (Signed)
Writer spoke with patient and his uncle. Patient acknowledges he was recently in [facility] ED. He states he became upset because a staff member told him they couldn't help him and he left. Patient reports he want help for his substance use. Following the death of his wife, he relapsed and have been using more than usual. He denies SI/HI and AV/H.   Writer called to follow up with [facility] [phone] option 3), about the referral but the nurse that review the referrals wasn't available. Was advised back 7:30pm

## 2020-09-17 NOTE — Consult Note (Signed)
[facility] Face-to-Face Psychiatry Consult   Reason for Consult:  psych consult Referring Physician:  Claudette Stapler, PA-C Patient Identification: Miguel Meadows MRN:  [id number] Principal Diagnosis: cocaine use disorder Diagnosis:  Cocaine induced mood disorder  Total Time spent with patient: 20 minutes  Subjective:   Miguel Meadows is a 50 y.o. male patient presenting for temporary placement awaiting an opening at [facility] in [place]. He left [facility] and came here as he thought they told him he needed to pursue his own detox.  HPI:   [name] is a 50 y.o. male with a past history of depression who was discharged from [facility] less than one hour ago. On assessment, the patient is calm/cooperative, in no apparent distress, eating a sandwich in hallway accompanied by uncle. Patient is alert and oriented and is a reliable historian. Patient stated to team that [facility] "first said that they set me up with [facility], but then the nurse made it seem like I was on my own with all of that." Patient states he's presenting to ED to wait until a spot at [facility] is available. Patient denies SI/HI/AVH, but does state that he sometimes hears his wife's voice (whom he states has been deceased for six months).  Past Psychiatric History:    -polysubstance abuse  -substance induced mood disorder  Risk to Self:  none Risk to Others:  none Prior Inpatient Therapy:  detox/rehabs Prior Outpatient Therapy:  not currently  Past Medical History:  Past Medical History:  Diagnosis Date   Anxiety    Depression    Hypertension     Past Surgical History:  Procedure Laterality Date   FRACTURE SURGERY     Family History: History reviewed. No pertinent family history. Family Psychiatric  History: not noted Social History:  Social History   Substance and Sexual Activity  Alcohol Use Yes     Social History   Substance and Sexual Activity  Drug Use Yes   Types: IV, Cocaine,  Marijuana, Methamphetamines    Social History   Socioeconomic History   Marital status: Legally Separated    Spouse name: Not on file   Number of children: Not on file   Years of education: Not on file   Highest education level: Not on file  Occupational History   Not on file  Tobacco Use   Smoking status: Every Day    Packs/day: 0.50    Types: Cigarettes   Smokeless tobacco: Never  Substance and Sexual Activity   Alcohol use: Yes   Drug use: Yes    Types: IV, Cocaine, Marijuana, Methamphetamines   Sexual activity: Yes  Other Topics Concern   Not on file  Social History Narrative   Not on file   Social Determinants of Health   Financial Resource Strain: Not on file  Food Insecurity: Not on file  Transportation Needs: Not on file  Physical Activity: Not on file  Stress: Not on file  Social Connections: Not on file   Additional Social History:    Allergies:  No Known Allergies  Labs:  Results for orders placed or performed during the hospital encounter of 09/16/20 (from the past 48 hour(s))  Comprehensive metabolic panel     Status: Abnormal   Collection Time: 09/16/20  4:31 PM  Result Value Ref Range   Sodium 140 135 - 145 mmol/L   Potassium 4.0 3.5 - 5.1 mmol/L   Chloride 107 98 - 111 mmol/L   CO2 23  22 - 32 mmol/L   Glucose, Bld 105 (H) 70 - 99 mg/dL    Comment: Glucose reference range applies only to samples taken after fasting for at least 8 hours.   BUN 9 6 - 20 mg/dL   Creatinine, Ser 1.61 0.61 - 1.24 mg/dL   Calcium 9.4 8.9 - 09.6 mg/dL   Total Protein 7.3 6.5 - 8.1 g/dL   Albumin 3.9 3.5 - 5.0 g/dL   AST 20 15 - 41 U/L   ALT 16 0 - 44 U/L   Alkaline Phosphatase 44 38 - 126 U/L   Total Bilirubin 0.7 0.3 - 1.2 mg/dL   GFR, Estimated >04 >54 mL/min    Comment: (NOTE) Calculated using the CKD-EPI Creatinine Equation (2021)    Anion gap 10 5 - 15    Comment: Performed at [facility], [address], [place], [place] [address]   Ethanol     Status: Abnormal   Collection Time: 09/16/20  4:31 PM  Result Value Ref Range   Alcohol, Ethyl (B) 125 (H) <10 mg/dL    Comment: (NOTE) Lowest detectable limit for serum alcohol is 10 mg/dL.  For medical purposes only. Performed at [facility], [address] [place], [place] [address]   CBC with Diff     Status: None   Collection Time: 09/16/20  4:31 PM  Result Value Ref Range   WBC 7.1 4.0 - 10.5 K/uL   RBC 4.86 4.22 - 5.81 MIL/uL   Hemoglobin 14.9 13.0 - 17.0 g/dL   HCT 29.5 62.1 - 30.8 %   MCV 93.2 80.0 - 100.0 fL   MCH 30.7 26.0 - 34.0 pg   MCHC 32.9 30.0 - 36.0 g/dL   RDW 65.7 84.6 - 96.2 %   Platelets 311 150 - 400 K/uL   nRBC 0.0 0.0 - 0.2 %   Neutrophils Relative % 56 %   Neutro Abs 4.0 1.7 - 7.7 K/uL   Lymphocytes Relative 34 %   Lymphs Abs 2.4 0.7 - 4.0 K/uL   Monocytes Relative 8 %   Monocytes Absolute 0.6 0.1 - 1.0 K/uL   Eosinophils Relative 2 %   Eosinophils Absolute 0.1 0.0 - 0.5 K/uL   Basophils Relative 0 %   Basophils Absolute 0.0 0.0 - 0.1 K/uL   Immature Granulocytes 0 %   Abs Immature Granulocytes 0.02 0.00 - 0.07 K/uL    Comment: Performed at [facility], [address] [place], [place] [address]  Acetaminophen level     Status: Abnormal   Collection Time: 09/16/20  4:31 PM  Result Value Ref Range   Acetaminophen (Tylenol), Serum <10 (L) 10 - 30 ug/mL    Comment: (NOTE) Therapeutic concentrations vary significantly. A range of 10-30 ug/mL  may be an effective concentration for many patients. However, some  are best treated at concentrations outside of this range. Acetaminophen concentrations >150 ug/mL at 4 hours after ingestion  and >50 ug/mL at 12 hours after ingestion are often associated with  toxic reactions.  Performed at [facility], [address], [place], [place] [address]   Salicylate level     Status: Abnormal   Collection Time: 09/16/20  4:31 PM  Result  Value Ref Range   Salicylate Lvl <7.0 (L) 7.0 - 30.0 mg/dL    Comment: Performed at [facility], [address], [place], [place] [address]  Urine rapid drug screen (hosp performed)     Status:  Abnormal   Collection Time: 09/16/20  4:44 PM  Result Value Ref Range   Opiates NONE DETECTED NONE DETECTED   Cocaine POSITIVE (A) NONE DETECTED   Benzodiazepines NONE DETECTED NONE DETECTED   Amphetamines NONE DETECTED NONE DETECTED   Tetrahydrocannabinol NONE DETECTED NONE DETECTED   Barbiturates NONE DETECTED NONE DETECTED    Comment: (NOTE) DRUG SCREEN FOR MEDICAL PURPOSES ONLY.  IF CONFIRMATION IS NEEDED FOR ANY PURPOSE, NOTIFY LAB WITHIN 5 DAYS.  LOWEST DETECTABLE LIMITS FOR URINE DRUG SCREEN Drug Class                     Cutoff (ng/mL) Amphetamine and metabolites    1000 Barbiturate and metabolites    200 Benzodiazepine                 200 Tricyclics and metabolites     300 Opiates and metabolites        300 Cocaine and metabolites        300 THC                            50 Performed at [facility], [address], [place], [place] [address]   Resp Panel by RT-PCR (Flu A&B, Covid) Nasopharyngeal Swab     Status: None   Collection Time: 09/16/20  5:18 PM   Specimen: Nasopharyngeal Swab; Nasopharyngeal(NP) swabs in vial transport medium  Result Value Ref Range   SARS Coronavirus 2 by RT PCR NEGATIVE NEGATIVE    Comment: (NOTE) SARS-CoV-2 target nucleic acids are NOT DETECTED.  The SARS-CoV-2 RNA is generally detectable in upper respiratory specimens during the acute phase of infection. The lowest concentration of SARS-CoV-2 viral copies this assay can detect is 138 copies/mL. A negative result does not preclude SARS-Cov-2 infection and should not be used as the sole basis for treatment or other patient management decisions. A negative result may occur with  improper specimen collection/handling, submission of specimen other than  nasopharyngeal swab, presence of viral mutation(s) within the areas targeted by this assay, and inadequate number of viral copies(<138 copies/mL). A negative result must be combined with clinical observations, patient history, and epidemiological information. The expected result is Negative.  Fact Sheet for Patients:  BloggerCourse.com  Fact Sheet for Healthcare Providers:  SeriousBroker.it  This test is no t yet approved or cleared by the [place] FDA and  has been authorized for detection and/or diagnosis of SARS-CoV-2 by FDA under an Emergency Use Authorization (EUA). This EUA will remain  in effect (meaning this test can be used) for the duration of the COVID-19 declaration under Section 564(b)(1) of the Act, 21 U.S.C.section 360bbb-3(b)(1), unless the authorization is terminated  or revoked sooner.       Influenza A by PCR NEGATIVE NEGATIVE   Influenza B by PCR NEGATIVE NEGATIVE    Comment: (NOTE) The Xpert Xpress SARS-CoV-2/FLU/RSV plus assay is intended as an aid in the diagnosis of influenza from Nasopharyngeal swab specimens and should not be used as a sole basis for treatment. Nasal washings and aspirates are unacceptable for Xpert Xpress SARS-CoV-2/FLU/RSV testing.  Fact Sheet for Patients: BloggerCourse.com  Fact Sheet for Healthcare Providers: SeriousBroker.it  This test is not yet approved or cleared by the [place] FDA and has been authorized for detection and/or diagnosis of SARS-CoV-2 by FDA under an Emergency Use Authorization (EUA). This EUA will remain in effect (meaning this test can be used)  for the duration of the COVID-19 declaration under Section 564(b)(1) of the Act, 21 U.S.C. section 360bbb-3(b)(1), unless the authorization is terminated or revoked.  Performed at [facility], [address] [place], [place]  [id number]     Current Facility-Administered Medications  Medication Dose Route Frequency Provider Last Rate Last Admin   LORazepam (ATIVAN) injection 0-4 mg  0-4 mg Intravenous Q6H Jene EveryKinner, Robert, MD       Or   LORazepam (ATIVAN) tablet 0-4 mg  0-4 mg Oral Q6H Jene EveryKinner, Robert, MD       [START ON 09/20/2020] LORazepam (ATIVAN) injection 0-4 mg  0-4 mg Intravenous Conception OmsQ12H Kinner, Robert, MD       Or   Melene Muller[START ON 09/20/2020] LORazepam (ATIVAN) tablet 0-4 mg  0-4 mg Oral Q12H Jene EveryKinner, Robert, MD       thiamine tablet 100 mg  100 mg Oral Daily Jene EveryKinner, Robert, MD       Or   thiamine (B-1) injection 100 mg  100 mg Intravenous Daily Jene EveryKinner, Robert, MD       No current outpatient medications on file.    Musculoskeletal: Strength & Muscle Tone: within normal limits Gait & Station: normal Patient leans: N/A  Psychiatric Specialty Exam:  Presentation  General Appearance:  Appropriate for Environment Eye Contact: Good Speech: Clear and Coherent Speech Volume: Normal   Mood and Affect  Mood: Euthymic Affect: Appropriate; Congruent  Thought Process  Thought Processes: Coherent Descriptions of Associations:Intact Orientation:Full (Time, Place and Person) Thought Content:Logical History of Schizophrenia/Schizoaffective disorder:No  Duration of Psychotic Symptoms:  NA Hallucinations:Hallucinations: None Ideas of Reference:None Suicidal Thoughts:Suicidal Thoughts: No Homicidal Thoughts:Homicidal Thoughts: No  Sensorium  Memory: Immediate Good; Recent Good; Remote Good Judgment: Fair Insight: Fair; Present  Executive Functions  Concentration: Good Attention Span: Good Recall: Good Fund of Knowledge: Good Language: Good  Psychomotor Activity  Psychomotor Activity: Psychomotor Activity: Normal  Assets  Assets: Physical Health; Resilience; Social Support; Leisure Time  Sleep  Sleep: Sleep: Good  Physical Exam: Physical Exam Vitals and nursing note reviewed.   Constitutional:      General: He is not in acute distress.    Appearance: He is normal weight. He is not ill-appearing, toxic-appearing or diaphoretic.  HENT:     Head: Normocephalic.     Nose: Nose normal.     Mouth/Throat:     Mouth: Mucous membranes are moist.     Pharynx: Oropharynx is clear.  Eyes:     Pupils: Pupils are equal, round, and reactive to light.  Cardiovascular:     Rate and Rhythm: Normal rate.     Pulses: Normal pulses.  Pulmonary:     Effort: Pulmonary effort is normal.  Musculoskeletal:        General: Normal range of motion.     Cervical back: Normal range of motion.  Skin:    General: Skin is warm and dry.  Neurological:     General: No focal deficit present.     Mental Status: He is alert and oriented to person, place, and time. Mental status is at baseline.  Psychiatric:        Attention and Perception: Attention and perception normal.        Mood and Affect: Mood and affect normal.        Speech: Speech normal.        Behavior: Behavior normal. Behavior is cooperative.        Thought Content: Thought content normal. Thought content is not  paranoid or delusional. Thought content does not include homicidal or suicidal ideation. Thought content does not include homicidal or suicidal plan.        Cognition and Memory: Cognition and memory normal.        Judgment: Judgment normal.   Review of Systems  Psychiatric/Behavioral:  Positive for substance abuse. Negative for suicidal ideas.   All other systems reviewed and are negative. Blood pressure (!) 128/111, pulse 90, temperature 98.6 F (37 C), temperature source Oral, resp. rate 20, height 5\' 6"  (1.676 m), weight 68 kg, SpO2 98 %. Body mass index is 24.21 kg/m.  Treatment Plan Summary: Cocaine induced mood disorder: -Started to gabapentin 300 mg TID Patient to transfer to [facility] when placement becomes available.   Disposition: Awaiting [facility] placement  , NP 09/17/2020  5:25 PM

## 2020-09-17 NOTE — ED Notes (Signed)
Psych NP at bedside

## 2020-09-17 NOTE — ED Provider Notes (Signed)
Emergency Medicine Observation Re-evaluation Note  Miguel Meadows is a 50 y.o. male, seen on rounds today.  Pt initially presented to the ED for complaints of Suicidal and Addiction Problem Currently, the patient is awake, alert, resting comfortably.  Physical Exam  BP 120/76   Pulse 71   Temp 98.7 F (37.1 C) (Oral)   Resp 15   SpO2 100%  Physical Exam General: In no apparent distress Lungs: Respirations even and unlabored Psych: No distress  ED Course / MDM  EKG:EKG Interpretation  Date/Time:  Saturday September 16 2020 16:43:59 EDT Ventricular Rate:  89 PR Interval:  108 QRS Duration: 86 QT Interval:  352 QTC Calculation: 429 R Axis:     Text Interpretation: EASI Derived Leads No significant change since last tracing Confirmed by Richardean Canal [phone]) on 09/16/2020 8:11:43 PM  I have reviewed the labs performed to date as well as medications administered while in observation.  Recent changes in the last 24 hours include evaluated by TTS yesterday evening, recommended inpatient psychiatric care.  Plan  Current plan is for social work working on facility placement.  Miguel Meadows is not under involuntary commitment.     Ernie Avena, MD 09/17/20 [phone]

## 2020-09-17 NOTE — Progress Notes (Signed)
Per Teodora Medici, patient meets criteria for inpatient treatment. There are no available or appropriate beds at [facility] today. CSW faxed referrals to the following facilities for review:  [facility]  Pending - No Request Sent N/A [address], [place] [address] [phone] --  [facility]  Pending - No Request Sent N/A [address] [place] [address] [phone] --  [facility]  Pending - No Request Sent N/A [address] [place] [address] [phone] --  [facility]  Pending - No Request Sent N/A [address] [place] [address] [phone] --  [facility]  Pending - No Request Sent N/A [address] [place], [place] [address] [phone] --  [facility]  Pending - No Request Sent N/A [address] [place] [address] [phone] --  [facility]  Pending - No Request Sent N/A [address], [place] [address] [phone] --  [facility]  Pending - No Request Sent N/A [address] [place] [address] [phone] --  [facility]  Pending - No Request Sent N/A [address] [place] [address] [phone] --  [facility]  Pending - No Request Sent N/A [address] [place] [address] [phone] --  [facility]  Pending - No Request Sent N/A [address] [place] [address] [phone] --  [facility]  Pending - No Request Sent N/A [address], [place] [address] [phone] --  [facility]  Pending - No Request Sent N/A [address] [place] [address] [phone] --  [facility]  [facility]  Pending - No Request Sent N/A [address] [place] [address] [phone] --  [facility]  Pending - No Request Sent N/A [address] [place] [address] [phone] --   TTS will continue to seek bed placement.  Crissie Reese, MSW, LCSW-A, LCAS-A Phone: [phone] Disposition/TOC

## 2020-09-17 NOTE — ED Notes (Signed)
Pt walked from his bed to outside of the unit into another unit. This Clinical research associate saw him while sitting with another pt. When asked where he was going, pt said "I want to go outside" This writer told pt that he was leading towards another unit if he wanted to go outside he'd be discharged. RN's at nurses station speaking to pt. Pt requesting to speak with his sister.

## 2020-09-17 NOTE — Consult Note (Addendum)
[facility] Face-to-Face Psychiatry Consult   Reason for Consult:  psych consult Referring Physician:  Claudette Stapler, PA-C Patient Identification: Miguel Meadows MRN:  [id number] Principal Diagnosis: Substance induced mood disorder (HCC) Diagnosis:  Principal Problem:   Substance induced mood disorder (HCC) Active Problems:   Cocaine abuse with cocaine-induced mood disorder (HCC)   Polysubstance abuse (HCC)   Total Time spent with patient: 20 minutes  Subjective:   Miguel Meadows is a 50 y.o. male patient admitted with substance induced mood disorder. Patient reported to [facility] requesting help with his drug use reporting crack, marijuana, heroin, and opioid use; UDS+cocaine, BAL 125.   On assessment patient presents calm and cooperative. Denies suicidal or homicidal ideations, auditory or visual hallucinations. Patient is alert and oriented; does not appear to be actively psychotic or responding to any external/internal stimuli. Patient requesting placement to [facility]; refused [facility] and [employer], [employer], and states he is not interested in any shelters or transitional housing placements. He is voluntary and requesting to be discharged. Patient is psychiatrically cleared.   HPI:   Miguel Meadows is a 50 y.o. male with a past history of depression who presented to the [facility] endorsing SI and substance abuse treatment. Patient states wife recently passed away 6 months ago and has been having depressive thoughts ever since.  Notes he does not want hurt anyone here in the hospital.  He admits to daily alcohol intake;shares a half a gallon with a few people daily.  Endorses alcohol intake prior to arrival as well as methamphetamines, opioids, and crack use. Endorses IVDU x 1 month ago. States he has completed [facility] drug treatment program and several other treatment programs before.   Past Psychiatric History:    -polysubstance abuse  -substance induced mood  disorder  Risk to Self:  pt denies Risk to Others:  pt denies  Prior Inpatient Therapy:  pt denies Prior Outpatient Therapy:  pt denies  Past Medical History: History reviewed. No pertinent past medical history. History reviewed. No pertinent surgical history. Family History: History reviewed. No pertinent family history. Family Psychiatric  History: not noted Social History:  Social History   Substance and Sexual Activity  Alcohol Use Yes     Social History   Substance and Sexual Activity  Drug Use Yes   Types: IV, Cocaine, Marijuana, Methamphetamines    Social History   Socioeconomic History   Marital status: Legally Separated    Spouse name: Not on file   Number of children: Not on file   Years of education: Not on file   Highest education level: Not on file  Occupational History   Not on file  Tobacco Use   Smoking status: Every Day    Packs/day: 0.50    Types: Cigarettes   Smokeless tobacco: Not on file  Substance and Sexual Activity   Alcohol use: Yes   Drug use: Yes    Types: IV, Cocaine, Marijuana, Methamphetamines   Sexual activity: Yes  Other Topics Concern   Not on file  Social History Narrative   Not on file   Social Determinants of Health   Financial Resource Strain: Not on file  Food Insecurity: Not on file  Transportation Needs: Not on file  Physical Activity: Not on file  Stress: Not on file  Social Connections: Not on file   Additional Social History:    Allergies:  No Known Allergies  Labs:  Results for orders placed or performed during the hospital encounter  of 09/16/20 (from the past 48 hour(s))  Comprehensive metabolic panel     Status: Abnormal   Collection Time: 09/16/20  4:31 PM  Result Value Ref Range   Sodium 140 135 - 145 mmol/L   Potassium 4.0 3.5 - 5.1 mmol/L   Chloride 107 98 - 111 mmol/L   CO2 23 22 - 32 mmol/L   Glucose, Bld 105 (H) 70 - 99 mg/dL    Comment: Glucose reference range applies only to samples taken  after fasting for at least 8 hours.   BUN 9 6 - 20 mg/dL   Creatinine, Ser 8.85 0.61 - 1.24 mg/dL   Calcium 9.4 8.9 - 02.7 mg/dL   Total Protein 7.3 6.5 - 8.1 g/dL   Albumin 3.9 3.5 - 5.0 g/dL   AST 20 15 - 41 U/L   ALT 16 0 - 44 U/L   Alkaline Phosphatase 44 38 - 126 U/L   Total Bilirubin 0.7 0.3 - 1.2 mg/dL   GFR, Estimated >74 >12 mL/min    Comment: (NOTE) Calculated using the CKD-EPI Creatinine Equation (2021)    Anion gap 10 5 - 15    Comment: Performed at [facility], [address] [place], [place] [address]  Ethanol     Status: Abnormal   Collection Time: 09/16/20  4:31 PM  Result Value Ref Range   Alcohol, Ethyl (B) 125 (H) <10 mg/dL    Comment: (NOTE) Lowest detectable limit for serum alcohol is 10 mg/dL.  For medical purposes only. Performed at [facility], [address], [place], [place] [address]   CBC with Diff     Status: None   Collection Time: 09/16/20  4:31 PM  Result Value Ref Range   WBC 7.1 4.0 - 10.5 K/uL   RBC 4.86 4.22 - 5.81 MIL/uL   Hemoglobin 14.9 13.0 - 17.0 g/dL   HCT 47.0 96.2 - 83.6 %   MCV 93.2 80.0 - 100.0 fL   MCH 30.7 26.0 - 34.0 pg   MCHC 32.9 30.0 - 36.0 g/dL   RDW 62.9 47.6 - 54.6 %   Platelets 311 150 - 400 K/uL   nRBC 0.0 0.0 - 0.2 %   Neutrophils Relative % 56 %   Neutro Abs 4.0 1.7 - 7.7 K/uL   Lymphocytes Relative 34 %   Lymphs Abs 2.4 0.7 - 4.0 K/uL   Monocytes Relative 8 %   Monocytes Absolute 0.6 0.1 - 1.0 K/uL   Eosinophils Relative 2 %   Eosinophils Absolute 0.1 0.0 - 0.5 K/uL   Basophils Relative 0 %   Basophils Absolute 0.0 0.0 - 0.1 K/uL   Immature Granulocytes 0 %   Abs Immature Granulocytes 0.02 0.00 - 0.07 K/uL    Comment: Performed at [facility], [address] [place], [place] [address]  Acetaminophen level     Status: Abnormal   Collection Time: 09/16/20  4:31 PM  Result Value Ref Range   Acetaminophen (Tylenol), Serum <10 (L) 10 - 30 ug/mL     Comment: (NOTE) Therapeutic concentrations vary significantly. A range of 10-30 ug/mL  may be an effective concentration for many patients. However, some  are best treated at concentrations outside of this range. Acetaminophen concentrations >150 ug/mL at 4 hours after ingestion  and >50 ug/mL at 12 hours after ingestion are often associated with  toxic reactions.  Performed at [facility], [address], [place], [place] [address]   Salicylate level  Status: Abnormal   Collection Time: 09/16/20  4:31 PM  Result Value Ref Range   Salicylate Lvl <7.0 (L) 7.0 - 30.0 mg/dL    Comment: Performed at [facility], [address] [place], [place] [address]  Urine rapid drug screen (hosp performed)     Status: Abnormal   Collection Time: 09/16/20  4:44 PM  Result Value Ref Range   Opiates NONE DETECTED NONE DETECTED   Cocaine POSITIVE (A) NONE DETECTED   Benzodiazepines NONE DETECTED NONE DETECTED   Amphetamines NONE DETECTED NONE DETECTED   Tetrahydrocannabinol NONE DETECTED NONE DETECTED   Barbiturates NONE DETECTED NONE DETECTED    Comment: (NOTE) DRUG SCREEN FOR MEDICAL PURPOSES ONLY.  IF CONFIRMATION IS NEEDED FOR ANY PURPOSE, NOTIFY LAB WITHIN 5 DAYS.  LOWEST DETECTABLE LIMITS FOR URINE DRUG SCREEN Drug Class                     Cutoff (ng/mL) Amphetamine and metabolites    1000 Barbiturate and metabolites    200 Benzodiazepine                 200 Tricyclics and metabolites     300 Opiates and metabolites        300 Cocaine and metabolites        300 THC                            50 Performed at [facility], [address], [place], [place] [address]   Resp Panel by RT-PCR (Flu A&B, Covid) Nasopharyngeal Swab     Status: None   Collection Time: 09/16/20  5:18 PM   Specimen: Nasopharyngeal Swab; Nasopharyngeal(NP) swabs in vial transport medium  Result Value Ref Range   SARS Coronavirus 2 by RT PCR  NEGATIVE NEGATIVE    Comment: (NOTE) SARS-CoV-2 target nucleic acids are NOT DETECTED.  The SARS-CoV-2 RNA is generally detectable in upper respiratory specimens during the acute phase of infection. The lowest concentration of SARS-CoV-2 viral copies this assay can detect is 138 copies/mL. A negative result does not preclude SARS-Cov-2 infection and should not be used as the sole basis for treatment or other patient management decisions. A negative result may occur with  improper specimen collection/handling, submission of specimen other than nasopharyngeal swab, presence of viral mutation(s) within the areas targeted by this assay, and inadequate number of viral copies(<138 copies/mL). A negative result must be combined with clinical observations, patient history, and epidemiological information. The expected result is Negative.  Fact Sheet for Patients:  BloggerCourse.com  Fact Sheet for Healthcare Providers:  SeriousBroker.it  This test is no t yet approved or cleared by the [place] FDA and  has been authorized for detection and/or diagnosis of SARS-CoV-2 by FDA under an Emergency Use Authorization (EUA). This EUA will remain  in effect (meaning this test can be used) for the duration of the COVID-19 declaration under Section 564(b)(1) of the Act, 21 U.S.C.section 360bbb-3(b)(1), unless the authorization is terminated  or revoked sooner.       Influenza A by PCR NEGATIVE NEGATIVE   Influenza B by PCR NEGATIVE NEGATIVE    Comment: (NOTE) The Xpert Xpress SARS-CoV-2/FLU/RSV plus assay is intended as an aid in the diagnosis of influenza from Nasopharyngeal swab specimens and should not be used as a sole basis for treatment. Nasal washings and aspirates are unacceptable for Xpert Xpress SARS-CoV-2/FLU/RSV testing.  Fact Sheet  for Patients: BloggerCourse.com  Fact Sheet for Healthcare  Providers: SeriousBroker.it  This test is not yet approved or cleared by the [place] FDA and has been authorized for detection and/or diagnosis of SARS-CoV-2 by FDA under an Emergency Use Authorization (EUA). This EUA will remain in effect (meaning this test can be used) for the duration of the COVID-19 declaration under Section 564(b)(1) of the Act, 21 U.S.C. section 360bbb-3(b)(1), unless the authorization is terminated or revoked.  Performed at [facility], [address], [place], [place] [address]     Current Facility-Administered Medications  Medication Dose Route Frequency Provider Last Rate Last Admin   gabapentin (NEURONTIN) capsule 300 mg  300 mg Oral TID Leevy-Johnson, Jaree Trinka A, NP   300 mg at 09/17/20 1011   LORazepam (ATIVAN) injection 0-4 mg  0-4 mg Intravenous Q6H Lyndel Safe W, PA-C       Or   LORazepam (ATIVAN) tablet 0-4 mg  0-4 mg Oral Q6H Lyndel Safe W, PA-C   1 mg at 09/17/20 1011   [START ON 09/19/2020] LORazepam (ATIVAN) injection 0-4 mg  0-4 mg Intravenous Q12H Cristina Gong, PA-C       Or   [START ON 09/19/2020] LORazepam (ATIVAN) tablet 0-4 mg  0-4 mg Oral Q12H Lyndel Safe W, PA-C       nicotine (NICODERM CQ - dosed in mg/24 hours) patch 21 mg  21 mg Transdermal Daily PRN Lyndel Safe W, PA-C       thiamine tablet 100 mg  100 mg Oral Daily Lyndel Safe W, PA-C   100 mg at 09/17/20 1011   Or   thiamine (B-1) injection 100 mg  100 mg Intravenous Daily Cristina Gong, PA-C       No current outpatient medications on file.    Musculoskeletal: Strength & Muscle Tone: within normal limits Gait & Station: normal Patient leans: N/A  Psychiatric Specialty Exam:  Presentation  General Appearance:  Appropriate for Environment Eye Contact: Good Speech: Clear and Coherent Speech Volume: Normal Handedness: No data recorded  Mood and Affect   Mood: Euthymic Affect: Appropriate; Congruent  Thought Process  Thought Processes: Coherent Descriptions of Associations:Intact Orientation:Full (Time, Place and Person) Thought Content:Logical History of Schizophrenia/Schizoaffective disorder:No  Duration of Psychotic Symptoms:No data recorded Hallucinations:Hallucinations: None Ideas of Reference:None Suicidal Thoughts:Suicidal Thoughts: No Homicidal Thoughts:Homicidal Thoughts: No  Sensorium  Memory: Immediate Good; Recent Good; Remote Good Judgment: Fair Insight: Fair; Present  Executive Functions  Concentration: Good Attention Span: Good Recall: Good Fund of Knowledge: Good Language: Good  Psychomotor Activity  Psychomotor Activity: Psychomotor Activity: Normal  Assets  Assets: Physical Health; Resilience; Social Support; Leisure Time  Sleep  Sleep: Sleep: Good  Physical Exam: Physical Exam Vitals and nursing note reviewed.  Constitutional:      General: He is not in acute distress.    Appearance: He is normal weight. He is not ill-appearing, toxic-appearing or diaphoretic.  HENT:     Head: Normocephalic.     Nose: Nose normal.     Mouth/Throat:     Mouth: Mucous membranes are moist.     Pharynx: Oropharynx is clear.  Eyes:     Pupils: Pupils are equal, round, and reactive to light.  Cardiovascular:     Rate and Rhythm: Normal rate.     Pulses: Normal pulses.  Pulmonary:     Effort: Pulmonary effort is normal.  Musculoskeletal:        General: Normal range of motion.  Cervical back: Normal range of motion.  Skin:    General: Skin is warm and dry.  Neurological:     General: No focal deficit present.     Mental Status: He is alert and oriented to person, place, and time. Mental status is at baseline.  Psychiatric:        Attention and Perception: Attention and perception normal.        Mood and Affect: Mood and affect normal.        Speech: Speech normal.        Behavior:  Behavior normal. Behavior is cooperative.        Thought Content: Thought content normal. Thought content is not paranoid or delusional. Thought content does not include homicidal or suicidal ideation. Thought content does not include homicidal or suicidal plan.        Cognition and Memory: Cognition and memory normal.        Judgment: Judgment normal.   Review of Systems  Psychiatric/Behavioral:  Positive for substance abuse. Negative for suicidal ideas.   All other systems reviewed and are negative. Blood pressure 128/76, pulse 81, temperature 98 F (36.7 C), temperature source Oral, resp. rate 15, SpO2 100 %. There is no height or weight on file to calculate BMI.  Treatment Plan Summary: Plan Discharge patient home with plan to follow-up with substance abuse treatment resources.   Disposition: No evidence of imminent risk to self or others at present.   Patient does not meet criteria for psychiatric inpatient admission. Supportive therapy provided about ongoing stressors. Discussed crisis plan, support from social network, calling 911, coming to the Emergency Department, and calling Suicide Hotline. Requested resources given to patient for individual follow-up.   Loletta ParishBrooke A Leevy-Johnson, NP 09/17/2020 1:46 PM

## 2020-09-17 NOTE — Progress Notes (Addendum)
TOC CSW on behalf of Crissie Reese, SW and Maxie Barb, NP contacted [facility] in [place] [phone].  CSW spoke with Ms. Worley.  [facility] does accept none insured pts.  Ms. Bufford Buttner asked that information be faxed to [phone] for a referral.  A nurse will review for acceptance and they will contact CSW  in reference to acceptance.    CSW has faxed information to Ms. Worley.  Romney Compean Tarpley-Carter, MSW, LCSW-A Pronouns:  She/Her/Hers [facility] Health[facility]itions of Care Clinical Social Worker Direct Number:  [phone] [email or web address]

## 2020-09-17 NOTE — ED Notes (Signed)
Pt's belongings include (bag 1/1):  1 black and blue [place]  1 pair of gray shorts 1 gray t-shirt  1 pair red underwear  1 pair of black socks  1 pair of red and gray slides  1 lighter 1 pack of cigarettes  $21 (4 $5 dollar bills, 1 $1 dollar bill)

## 2020-09-17 NOTE — ED Notes (Signed)
Ambulated to RR with steady gait
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
## 2020-09-18 NOTE — Discharge Instructions (Addendum)
Discharged to [facility].

## 2020-09-18 NOTE — ED Notes (Signed)
Pt given breakfast tray

## 2020-09-18 NOTE — ED Notes (Signed)
Pt confirms ride to [facility] via uncle coming from [place]. States he does not take any regular medications. Left with all of belongings. Offered for patient to stay in hallway bed until uncle arrived; declines. States he wants to wait in lobby. Confirms that he will stay here until uncle arrives. Verified correct patient and correct discharge papers given. Pt alert and oriented X 4, stable for discharge. RR even and unlabored, color WNL. Discussed discharge instructions and follow-up as directed. Discharge medications discussed, when prescribed. Pt had opportunity to ask questions, and RN available to provide patient and/or family education.

## 2020-09-18 NOTE — ED Notes (Signed)
VOL/pending placement to [facility]

## 2020-09-18 NOTE — ED Notes (Signed)
Pt spoke with [facility] directly who reports that they do have a spot for him. Pt states his girlfriend will take him to [facility]. Updated EDP and TTS

## 2020-09-18 NOTE — ED Provider Notes (Signed)
Nurse did confirm that the [facility] that have availability and a family member is going to take patient to [facility].     Concha Se, MD 09/18/20 [phone]

## 2020-09-18 NOTE — ED Notes (Signed)
VS not obtained at this time d/t pt being asleep.  

## 2020-09-18 NOTE — ED Provider Notes (Signed)
Emergency Medicine Observation Re-evaluation Note  Miguel Meadows is a 50 y.o. male, seen on rounds today.  Pt initially presented to the ED for complaints of Psychiatric Evaluation Currently, the patient is resting, voices no medical complaints.  Physical Exam  BP (!) 128/111   Pulse 90   Temp 98.6 F (37 C) (Oral)   Resp 20   Ht 5\' 6"  (1.676 m)   Wt 68 kg   SpO2 98%   BMI 24.21 kg/m  Physical Exam General: Resting in no acute distress Cardiac: No cyanosis Lungs: Equal rise and fall Psych: Not agitated  ED Course / MDM  EKG:   I have reviewed the labs performed to date as well as medications administered while in observation.  Recent changes in the last 24 hours include no events overnight.  Plan  Current plan is for psychiatric disposition.  Miguel Meadows is not under involuntary commitment.     Kirby Crigler, MD 09/18/20 [phone]

## 2020-09-18 NOTE — BH Assessment (Signed)
Writer called [facility] [phone] option 3 to check on referral. Emmanuel (nurse) says to re-fax to [phone]. Task completed at 8:00am.

## 2020-09-26 ENCOUNTER — Ambulatory Visit: Payer: Self-pay

## 2020-09-29 ENCOUNTER — Ambulatory Visit: Payer: Self-pay | Admitting: Family Medicine

## 2020-09-29 ENCOUNTER — Ambulatory Visit: Payer: Self-pay

## 2020-09-29 ENCOUNTER — Encounter: Payer: Self-pay | Admitting: Family Medicine

## 2020-09-29 ENCOUNTER — Other Ambulatory Visit: Payer: Self-pay

## 2020-09-29 DIAGNOSIS — F1910 Other psychoactive substance abuse, uncomplicated: Secondary | ICD-10-CM

## 2020-09-29 DIAGNOSIS — Z113 Encounter for screening for infections with a predominantly sexual mode of transmission: Secondary | ICD-10-CM

## 2020-09-29 DIAGNOSIS — F32A Depression, unspecified: Secondary | ICD-10-CM

## 2020-09-29 DIAGNOSIS — F419 Anxiety disorder, unspecified: Secondary | ICD-10-CM

## 2020-09-29 LAB — GRAM STAIN

## 2020-09-29 LAB — HEPATITIS B SURFACE ANTIGEN

## 2020-09-29 LAB — HM HEPATITIS C SCREENING LAB: HM Hepatitis Screen: NEGATIVE

## 2020-09-29 LAB — HM HIV SCREENING LAB: HM HIV Screening: NEGATIVE

## 2020-09-29 NOTE — Progress Notes (Signed)
Gram stain reviewed, no treatment indicated. Patient states he got a text about mychart and will sign up.Burt Knack, RN

## 2020-09-29 NOTE — Progress Notes (Signed)
Here for STD testing..Sandrina Heaton Brewer-Jensen, RN  
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
## 2020-10-03 LAB — GONOCOCCUS CULTURE

## 2020-10-16 ENCOUNTER — Emergency Department
Admission: EM | Admit: 2020-10-16 | Discharge: 2020-10-16 | Disposition: A | Discharge status: Home / Self Care | Payer: Self-pay | Attending: Emergency Medicine | Admitting: Emergency Medicine

## 2020-10-16 ENCOUNTER — Other Ambulatory Visit: Payer: Self-pay

## 2020-10-16 ENCOUNTER — Encounter: Payer: Self-pay | Admitting: Emergency Medicine

## 2020-10-16 DIAGNOSIS — M542 Cervicalgia: Secondary | ICD-10-CM | POA: Insufficient documentation

## 2020-10-16 DIAGNOSIS — I10 Essential (primary) hypertension: Secondary | ICD-10-CM | POA: Insufficient documentation

## 2020-10-16 DIAGNOSIS — M545 Low back pain, unspecified: Secondary | ICD-10-CM | POA: Insufficient documentation

## 2020-10-16 DIAGNOSIS — F17210 Nicotine dependence, cigarettes, uncomplicated: Secondary | ICD-10-CM | POA: Insufficient documentation

## 2020-10-16 DIAGNOSIS — R519 Headache, unspecified: Secondary | ICD-10-CM | POA: Insufficient documentation

## 2020-10-16 DIAGNOSIS — Y92410 Unspecified street and highway as the place of occurrence of the external cause: Secondary | ICD-10-CM | POA: Insufficient documentation

## 2020-10-16 MED ORDER — NAPROXEN 500 MG PO TABS: 500.0000 mg | ORAL_TABLET | Freq: Two times a day (BID) | ORAL | Status: DC

## 2020-10-16 MED ORDER — ORPHENADRINE CITRATE ER 100 MG PO TB12: 100.0000 mg | ORAL_TABLET | Freq: Two times a day (BID) | ORAL | 0 refills | Status: DC

## 2020-10-16 NOTE — Discharge Instructions (Addendum)
Read and follow discharge care instructions.  Return to ER if condition worsens.
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
## 2020-11-30 ENCOUNTER — Other Ambulatory Visit: Payer: Self-pay

## 2020-11-30 ENCOUNTER — Encounter (HOSPITAL_COMMUNITY): Payer: Self-pay

## 2020-11-30 ENCOUNTER — Emergency Department (HOSPITAL_COMMUNITY): Payer: Self-pay

## 2020-11-30 ENCOUNTER — Emergency Department (HOSPITAL_COMMUNITY)
Admission: EM | Admit: 2020-11-30 | Discharge: 2020-12-01 | Disposition: A | Discharge status: Home / Self Care | Payer: Self-pay | Attending: Emergency Medicine | Admitting: Emergency Medicine

## 2020-11-30 DIAGNOSIS — F17210 Nicotine dependence, cigarettes, uncomplicated: Secondary | ICD-10-CM | POA: Insufficient documentation

## 2020-11-30 DIAGNOSIS — W228XXA Striking against or struck by other objects, initial encounter: Secondary | ICD-10-CM | POA: Insufficient documentation

## 2020-11-30 DIAGNOSIS — S0990XA Unspecified injury of head, initial encounter: Secondary | ICD-10-CM

## 2020-11-30 DIAGNOSIS — S0101XA Laceration without foreign body of scalp, initial encounter: Secondary | ICD-10-CM | POA: Insufficient documentation

## 2020-11-30 DIAGNOSIS — R0602 Shortness of breath: Secondary | ICD-10-CM | POA: Insufficient documentation

## 2020-11-30 DIAGNOSIS — R079 Chest pain, unspecified: Secondary | ICD-10-CM | POA: Insufficient documentation

## 2020-11-30 DIAGNOSIS — I10 Essential (primary) hypertension: Secondary | ICD-10-CM | POA: Insufficient documentation

## 2020-11-30 LAB — CBC WITH DIFFERENTIAL/PLATELET
Abs Immature Granulocytes: 0.01 10*3/uL (ref 0.00–0.07)
Basophils Absolute: 0 10*3/uL (ref 0.0–0.1)
Basophils Relative: 0 %
Eosinophils Absolute: 0.1 10*3/uL (ref 0.0–0.5)
Eosinophils Relative: 1 %
HCT: 40.5 % (ref 39.0–52.0)
Hemoglobin: 13.8 g/dL (ref 13.0–17.0)
Immature Granulocytes: 0 %
Lymphocytes Relative: 38 %
Lymphs Abs: 3 10*3/uL (ref 0.7–4.0)
MCH: 31.2 pg (ref 26.0–34.0)
MCHC: 34.1 g/dL (ref 30.0–36.0)
MCV: 91.4 fL (ref 80.0–100.0)
Monocytes Absolute: 0.4 10*3/uL (ref 0.1–1.0)
Monocytes Relative: 6 %
Neutro Abs: 4.3 10*3/uL (ref 1.7–7.7)
Neutrophils Relative %: 55 %
Platelets: 369 10*3/uL (ref 150–400)
RBC: 4.43 MIL/uL (ref 4.22–5.81)
RDW: 13.2 % (ref 11.5–15.5)
WBC: 7.9 10*3/uL (ref 4.0–10.5)
nRBC: 0 % (ref 0.0–0.2)

## 2020-11-30 LAB — COMPREHENSIVE METABOLIC PANEL
ALT: 22 U/L (ref 0–44)
AST: 29 U/L (ref 15–41)
Albumin: 4 g/dL (ref 3.5–5.0)
Alkaline Phosphatase: 40 U/L (ref 38–126)
Anion gap: 8 (ref 5–15)
BUN: 8 mg/dL (ref 6–20)
CO2: 25 mmol/L (ref 22–32)
Calcium: 8.8 mg/dL — ABNORMAL LOW (ref 8.9–10.3)
Chloride: 102 mmol/L (ref 98–111)
Creatinine, Ser: 0.9 mg/dL (ref 0.61–1.24)
GFR, Estimated: >60 mL/min (ref >60)
Glucose, Bld: 96 mg/dL (ref 70–99)
Potassium: 3.7 mmol/L (ref 3.5–5.1)
Sodium: 135 mmol/L (ref 135–145)
Total Bilirubin: 0.3 mg/dL (ref 0.3–1.2)
Total Protein: 7 g/dL (ref 6.5–8.1)

## 2020-11-30 LAB — RAPID URINE DRUG SCREEN, HOSP PERFORMED
Amphetamines: NOT DETECTED
Barbiturates: NOT DETECTED
Benzodiazepines: NOT DETECTED
Cocaine: POSITIVE — AB
Opiates: NOT DETECTED
Tetrahydrocannabinol: NOT DETECTED

## 2020-11-30 LAB — TROPONIN I (HIGH SENSITIVITY): Troponin I (High Sensitivity): 2 ng/L (ref <18)

## 2020-11-30 LAB — LIPASE, BLOOD: Lipase: 29 U/L (ref 11–51)

## 2020-11-30 MED ORDER — KETOROLAC TROMETHAMINE 30 MG/ML IJ SOLN
30.0000 mg | Freq: Once | INTRAMUSCULAR | Status: AC
  Administered 2020-11-30: 30 mg via INTRAVENOUS
  Filled 2020-11-30: qty 1
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
## 2020-11-30 NOTE — ED Provider Notes (Signed)
  Provider Note MRN:  [id number]  Arrival date & time: 12/01/20    ED Course and Medical Decision Making  Assumed care from Dr. Charm Barges at shift change.  Chest pain, recent cocaine use, awaiting second troponin.  Candidate for discharge.  Second troponin negative.  Procedures  Final Clinical Impressions(s) / ED Diagnoses     ICD-10-CM   1. Chest pain, unspecified type  R07.9       ED Discharge Orders     None         Discharge Instructions      You were evaluated in the Emergency Department and after careful evaluation, we did not find any emergent condition requiring admission or further testing in the hospital.  Your exam/testing today was overall reassuring.  We advise stopping the use of cocaine and follow-up with a primary care doctor.  Please return to the Emergency Department if you experience any worsening of your condition.  Thank you for allowing [place] to be a part of your care.       Elmer Sow. Pilar Plate, MD [facility] Emergency Medicine [facility] [email or web address]    Sabas Sous, MD 12/01/20 [phone]

## 2020-12-01 LAB — TROPONIN I (HIGH SENSITIVITY): Troponin I (High Sensitivity): 2 ng/L (ref <18)

## 2020-12-01 NOTE — Discharge Instructions (Signed)
You were evaluated in the Emergency Department and after careful evaluation, we did not find any emergent condition requiring admission or further testing in the hospital.  Your exam/testing today was overall reassuring.  We advise stopping the use of cocaine and follow-up with a primary care doctor.  Please return to the Emergency Department if you experience any worsening of your condition.  Thank you for allowing [place] to be a part of your care.

## 2020-12-01 NOTE — ED Notes (Signed)
Patient left ED with ABCs intact, alert and oriented x4, respirations even and unlabored. Discharge instructions reviewed and all questions answered.   

## 2021-06-02 ENCOUNTER — Emergency Department (HOSPITAL_COMMUNITY)
Admission: EM | Admit: 2021-06-02 | Discharge: 2021-06-05 | Disposition: A | Discharge status: Home / Self Care | Payer: Managed Care, Other (non HMO) | Attending: Emergency Medicine | Admitting: Emergency Medicine

## 2021-06-02 ENCOUNTER — Other Ambulatory Visit: Payer: Self-pay

## 2021-06-02 DIAGNOSIS — F1414 Cocaine abuse with cocaine-induced mood disorder: Secondary | ICD-10-CM | POA: Diagnosis present

## 2021-06-02 DIAGNOSIS — Z20822 Contact with and (suspected) exposure to covid-19: Secondary | ICD-10-CM | POA: Insufficient documentation

## 2021-06-02 DIAGNOSIS — F32A Depression, unspecified: Secondary | ICD-10-CM | POA: Insufficient documentation

## 2021-06-02 DIAGNOSIS — R45851 Suicidal ideations: Secondary | ICD-10-CM | POA: Diagnosis not present

## 2021-06-02 NOTE — ED Triage Notes (Addendum)
Patient BIB EMS c/o SI. Per report patient is been depressed since he loss his wife a year ago. Pt stated he plans to hurt himself with cocaine overdose. Patient Alcohol intoxicated. Pt calm and cooperated.  BP 110/78 HR 90 RR 20 O2sat 97% on RA

## 2021-06-03 LAB — COMPREHENSIVE METABOLIC PANEL
ALT: 23 U/L (ref 0–44)
AST: 35 U/L (ref 15–41)
Albumin: 4.3 g/dL (ref 3.5–5.0)
Alkaline Phosphatase: 37 U/L — ABNORMAL LOW (ref 38–126)
Anion gap: 10 (ref 5–15)
BUN: 11 mg/dL (ref 6–20)
CO2: 26 mmol/L (ref 22–32)
Calcium: 9.6 mg/dL (ref 8.9–10.3)
Chloride: 101 mmol/L (ref 98–111)
Creatinine, Ser: 0.86 mg/dL (ref 0.61–1.24)
GFR, Estimated: >60 mL/min (ref >60)
Glucose, Bld: 88 mg/dL (ref 70–99)
Potassium: 3.6 mmol/L (ref 3.5–5.1)
Sodium: 137 mmol/L (ref 135–145)
Total Bilirubin: 1.1 mg/dL (ref 0.3–1.2)
Total Protein: 7.7 g/dL (ref 6.5–8.1)

## 2021-06-03 LAB — CBC
HCT: 43.9 % (ref 39.0–52.0)
Hemoglobin: 15.2 g/dL (ref 13.0–17.0)
MCH: 31.7 pg (ref 26.0–34.0)
MCHC: 34.6 g/dL (ref 30.0–36.0)
MCV: 91.5 fL (ref 80.0–100.0)
Platelets: 298 10*3/uL (ref 150–400)
RBC: 4.8 MIL/uL (ref 4.22–5.81)
RDW: 12.8 % (ref 11.5–15.5)
WBC: 8.6 10*3/uL (ref 4.0–10.5)
nRBC: 0 % (ref 0.0–0.2)

## 2021-06-03 LAB — RAPID URINE DRUG SCREEN, HOSP PERFORMED
Amphetamines: NOT DETECTED
Barbiturates: NOT DETECTED
Benzodiazepines: NOT DETECTED
Cocaine: POSITIVE — AB
Opiates: NOT DETECTED
Tetrahydrocannabinol: NOT DETECTED

## 2021-06-03 LAB — ETHANOL: Alcohol, Ethyl (B): <10 mg/dL (ref <10)

## 2021-06-03 LAB — SALICYLATE LEVEL: Salicylate Lvl: <7 mg/dL — ABNORMAL LOW (ref 7.0–30.0)

## 2021-06-03 LAB — RESP PANEL BY RT-PCR (FLU A&B, COVID) ARPGX2
Influenza A by PCR: NEGATIVE
Influenza B by PCR: NEGATIVE
SARS Coronavirus 2 by RT PCR: NEGATIVE

## 2021-06-03 LAB — ACETAMINOPHEN LEVEL: Acetaminophen (Tylenol), Serum: <10 ug/mL — ABNORMAL LOW (ref 10–30)

## 2021-06-03 MED ORDER — THIAMINE HCL 100 MG PO TABS
100.0000 mg | ORAL_TABLET | Freq: Every day | ORAL | Status: DC
  Administered 2021-06-03 – 2021-06-04 (×2): 100 mg via ORAL
  Filled 2021-06-03 (×2): qty 1

## 2021-06-03 MED ORDER — LORAZEPAM 1 MG PO TABS: 0.0000 mg | ORAL_TABLET | Freq: Two times a day (BID) | ORAL | Status: DC

## 2021-06-03 MED ORDER — LORAZEPAM 2 MG/ML IJ SOLN
0.0000 mg | Freq: Four times a day (QID) | INTRAMUSCULAR | Status: AC
  Filled 2021-06-03: qty 1

## 2021-06-03 MED ORDER — LORAZEPAM 2 MG/ML IJ SOLN: 0.0000 mg | Freq: Two times a day (BID) | INTRAMUSCULAR | Status: DC

## 2021-06-03 MED ORDER — THIAMINE HCL 100 MG/ML IJ SOLN: 100.0000 mg | Freq: Every day | INTRAMUSCULAR | Status: DC

## 2021-06-03 MED ORDER — LORAZEPAM 1 MG PO TABS
0.0000 mg | ORAL_TABLET | Freq: Four times a day (QID) | ORAL | Status: AC
  Administered 2021-06-03: 2 mg via ORAL
  Administered 2021-06-03: 1 mg via ORAL
  Filled 2021-06-03: qty 2
  Filled 2021-06-03: qty 1

## 2021-06-03 NOTE — BHH Counselor (Signed)
Pt requested his uncle Shloime Keilman, [phone]) to be called during day shift. Pt wants uncle to know he's in the hospital and why he's in the hospital. Pt did not want clinician to call his uncle because it's too late.    Redmond Pulling, MS, [facility], [facility] Triage Specialist [phone]

## 2021-06-03 NOTE — BH Assessment (Signed)
Clinician messaged Ariel K.Effie Shy, RN: "Hey. It's Trey with TTS. Is the pt able to engage in the assessment, if so the pt will need to be placed in a private room. Also is the pt under IVC?"  Clinician awaiting response.    Redmond Pulling, MS, [facility], [facility] Triage Specialist [phone]

## 2021-06-03 NOTE — Progress Notes (Signed)
CSW spoke with Barbara Snyder, RN with [facility] in reference to a referral sent for possible placement. It was reported that the patient will be reviewed with there provider for for potential placement tomorrow. Barbara, Rn advised she will contact CSW back with a further update.  Kaelyn Innocent, MSW, LCSW-A, LCAS Phone: [phone] Disposition/TOC    

## 2021-06-03 NOTE — ED Notes (Signed)
Red sweater, blue jeans, cell phone, hat place at nurse desk

## 2021-06-03 NOTE — ED Notes (Addendum)
Pt got up from bed glass item found on bed pt pick up item place inside glove broke glass and place in sharp box
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
## 2021-06-03 NOTE — ED Provider Notes (Addendum)
[facility]-EMERGENCY DEPT Provider Note   CSN: [id number] Arrival date & time: 06/02/21  2237     History  Chief Complaint  Patient presents with   Suicidal    Miguel Meadows is a 51 y.o. male.  Patient presents to the emergency department for evaluation of depression and suicidal ideation.  Patient reports chronic depression and drug abuse.  He reports he has been more depressed since his wife died a year ago.  Patient reports that he plans to overdose if he does not get help.      Home Medications Prior to Admission medications   Medication Sig Start Date End Date Taking? Authorizing Provider  naproxen (NAPROSYN) 500 MG tablet Take 1 tablet (500 mg total) by mouth 2 (two) times daily with a meal. 10/16/20   Joni Reining, PA-C  orphenadrine (NORFLEX) 100 MG tablet Take 1 tablet (100 mg total) by mouth 2 (two) times daily. 10/16/20   Joni Reining, PA-C      Allergies    Patient has no known allergies.    Review of Systems   Review of Systems  Physical Exam Updated Vital Signs BP 135/80   Pulse 80   Temp 98 F (36.7 C) (Oral)   Resp 16   Ht 5\' 7"  (1.702 m)   Wt 71.2 kg   SpO2 99%   BMI 24.59 kg/m  Physical Exam Vitals and nursing note reviewed.  Constitutional:      General: He is not in acute distress.    Appearance: He is well-developed.  HENT:     Head: Normocephalic and atraumatic.     Mouth/Throat:     Mouth: Mucous membranes are moist.  Eyes:     General: Vision grossly intact. Gaze aligned appropriately.     Extraocular Movements: Extraocular movements intact.     Conjunctiva/sclera: Conjunctivae normal.  Cardiovascular:     Rate and Rhythm: Normal rate and regular rhythm.     Pulses: Normal pulses.     Heart sounds: Normal heart sounds, S1 normal and S2 normal. No murmur heard.   No friction rub. No gallop.  Pulmonary:     Effort: Pulmonary effort is normal. No respiratory distress.     Breath sounds: Normal  breath sounds.  Abdominal:     Palpations: Abdomen is soft.     Tenderness: There is no abdominal tenderness. There is no guarding or rebound.     Hernia: No hernia is present.  Musculoskeletal:        General: No swelling.     Cervical back: Full passive range of motion without pain, normal range of motion and neck supple. No pain with movement, spinous process tenderness or muscular tenderness. Normal range of motion.     Right lower leg: No edema.     Left lower leg: No edema.  Skin:    General: Skin is warm and dry.     Capillary Refill: Capillary refill takes less than 2 seconds.     Findings: No ecchymosis, erythema, lesion or wound.  Neurological:     Mental Status: He is alert and oriented to person, place, and time.     GCS: GCS eye subscore is 4. GCS verbal subscore is 5. GCS motor subscore is 6.     Cranial Nerves: Cranial nerves 2-12 are intact.     Sensory: Sensation is intact.     Motor: Motor function is intact. No weakness or abnormal muscle tone.  Coordination: Coordination is intact.  Psychiatric:        Mood and Affect: Mood normal.        Speech: Speech normal.        Behavior: Behavior normal.        Thought Content: Thought content includes suicidal ideation.    ED Results / Procedures / Treatments   Labs (all labs ordered are listed, but only abnormal results are displayed) Labs Reviewed  COMPREHENSIVE METABOLIC PANEL  ETHANOL  SALICYLATE LEVEL  ACETAMINOPHEN LEVEL  CBC  RAPID URINE DRUG SCREEN, HOSP PERFORMED    EKG EKG Interpretation  Date/Time:  Sunday Jun 03 2021 03:43:44 EDT Ventricular Rate:  68 PR Interval:  118 QRS Duration: 90 QT Interval:  406 QTC Calculation: 432 R Axis:   84 Text Interpretation: Sinus rhythm Atrial premature complexes Borderline short PR interval Confirmed by Gilda Crease [phone]) on 06/03/2021 3:48:14 AM  Radiology No results found.  Procedures Procedures    Medications Ordered in  ED Medications - No data to display  ED Course/ Medical Decision Making/ A&P                           Medical Decision Making Amount and/or Complexity of Data Reviewed Labs: ordered.  Risk OTC drugs. Prescription drug management.   Patient presents with worsening depression and now suicidal ideation with a plan.  Will require psychiatric evaluation.  Medically clear for psychiatric treatment.        Final Clinical Impression(s) / ED Diagnoses Final diagnoses:  Suicidal ideation    Rx / DC Orders ED Discharge Orders     None         Yaasir Menken, Canary Brim, MD 06/03/21 0008    Gilda Crease, MD 06/03/21 [phone]

## 2021-06-03 NOTE — Progress Notes (Signed)
Per Cecilio Asper, NP, patient meets criteria for inpatient treatment. There are no available beds at [facility] today, per [facility]. CSW faxed referrals to the following facilities for review:  [facility]  Pending - Request Sent N/A [address] [place] [address] [phone] --  [facility]  Pending - Request Sent N/A [address], [place] [address] [phone] --  [facility]  Pending - Request Sent N/A [address] [place] [address] [phone] --  [facility]  Pending - Request Sent N/A [address] [facility] [place] [address] [phone] --  [facility]  Pending - Request Sent N/A [address] [place] [address] [phone] --  [facility]  Pending - Request Sent N/A [address] [place] [address] [phone] --  [facility]  Pending - Request Sent N/A [address] [place], [place] [address] [phone] --  [facility]  Pending - Request Sent N/A [address] [place] [address] [phone] --  [facility]  Pending - Request Sent N/A [address] [place] [address] [phone] --  [facility]  Pending - Request Sent N/A [address] [place] [address] [phone] --  [facility]  Pending - Request Sent N/A [address], [place] [address] [phone] --  [facility]  Pending - Request Sent N/A [address] [place] [address] [phone] --  [facility]  Pending - Request Sent N/A [address], [place] [address] [phone] --  [facility]  Pending - Request Sent N/A [address], [place] [address] [phone] --  [facility]  Pending - Request Sent N/A [address] [place] [address] [phone] --   TTS will continue to seek bed placement.  Crissie Reese, MSW, Lenice Pressman Phone: [phone] Disposition/TOC

## 2021-06-03 NOTE — ED Notes (Signed)
Patient refused to be change with purple scrubs and stated "just let me sleep for now". Will try again if he is sober enough.

## 2021-06-04 ENCOUNTER — Encounter (HOSPITAL_COMMUNITY): Payer: Self-pay

## 2021-06-04 LAB — URINALYSIS, ROUTINE W REFLEX MICROSCOPIC
Bacteria, UA: NONE SEEN
Bilirubin Urine: NEGATIVE
Glucose, UA: NEGATIVE mg/dL
Ketones, ur: NEGATIVE mg/dL
Nitrite: NEGATIVE
Protein, ur: NEGATIVE mg/dL
Specific Gravity, Urine: 1.006 (ref 1.005–1.030)
WBC, UA: >50 WBC/hpf — ABNORMAL HIGH (ref 0–5)
pH: 6 (ref 5.0–8.0)

## 2021-06-04 MED ORDER — LIP MEDEX EX OINT
TOPICAL_OINTMENT | Freq: Once | CUTANEOUS | Status: AC
  Administered 2021-06-04: 1 via TOPICAL
  Filled 2021-06-04: qty 7

## 2021-06-04 MED ORDER — TRAZODONE HCL 100 MG PO TABS
100.0000 mg | ORAL_TABLET | Freq: Every day | ORAL | Status: DC
  Administered 2021-06-04: 100 mg via ORAL
  Filled 2021-06-04: qty 1

## 2021-06-04 NOTE — ED Notes (Signed)
Pt requesting chap stick

## 2021-06-04 NOTE — ED Notes (Signed)
Pt requested his fiance be called to make sure she was okay. This nurse called and spoke with Dianna and she stated she was fine and would see the pt tomorrow. Pt was updated.

## 2021-06-04 NOTE — ED Notes (Signed)
Pt updated on bed referral status and reeval.

## 2021-06-04 NOTE — Progress Notes (Signed)
Inpatient Behavioral Health Placement  Pt meets inpatient criteria per Leandro Reasoner, NP. Referral was sent to the following facilities;   Destination Service Provider Address Phone Fax  [facility]  [address] [place] [address] [phone]  [place]  [address] [place] [address] [phone]  [facility]  [address], [place] [address] [phone]  [facility] [address] [place] [address] [phone]  [facility]  [address] [place] [address] [phone]  [place], [place] [address] [phone]  [facility]  [address] [place], [place] [address] [phone]  [facility]  [address] [place] [address] [facility]  [address], [place] [address] [phone]  [place]  [address] [place] [address] [phone]  [facility]  [address] [place] [address] [id number] [facility]  [address] [place] [address] [place]  [facility]  [address] [place] [address] [phone] [facility]  [address] [place] [address] [phone]  [facility]  [address] [place] [address] [id number] [place] [place] [address] [place]  [facility]  [place], [place] [address] [phone] [facility]  [address] [place] [address] [phone] [facility]  [address] [place] [address] [phone]  [facility]  [address] [place] [address] [phone]  [facility]  [address] [place] [address] [phone]    Situation ongoing,  CSW will follow up.   Benjaman Kindler, MSW, LCSWA 06/04/2021  @ 2:15 PM

## 2021-06-04 NOTE — ED Notes (Signed)
Visitor at bedside. This nurse contacted pt work per request of pt to let them know pt was in hospital and would not be at work.

## 2021-06-04 NOTE — ED Provider Notes (Signed)
Emergency Medicine Observation Re-evaluation Note  Miguel Meadows is a 51 y.o. male, seen on rounds today.  Pt initially presented to the ED for complaints of Suicidal Currently, the patient is sleeping.  Physical Exam  BP (!) 118/91 (BP Location: Left Arm)   Pulse 67   Temp 97.8 F (36.6 C) (Oral)   Resp 18   Ht 5\' 7"  (1.702 m)   Wt 71.2 kg   SpO2 99%   BMI 24.59 kg/m  Physical Exam General: Sleeping Cardiac: Extremities well-perfused Lungs: Breathing is unlabored Psych: Deferred  ED Course / MDM  EKG:EKG Interpretation  Date/Time:  Sunday Jun 03 2021 03:43:44 EDT Ventricular Rate:  68 PR Interval:  118 QRS Duration: 90 QT Interval:  406 QTC Calculation: 432 R Axis:   84 Text Interpretation: Sinus rhythm Atrial premature complexes Borderline short PR interval Confirmed by 07-08-1999 [phone]) on 06/03/2021 3:48:14 AM  I have reviewed the labs performed to date as well as medications administered while in observation.  Recent changes in the last 24 hours include none.  Plan  Current plan is for inpatient psychiatric admission.  Miguel Meadows is not under involuntary commitment.    Miguel Crigler, MD 06/04/21 1106

## 2021-06-04 NOTE — ED Notes (Signed)
Pt upset and refusing to move to hall.

## 2021-06-04 NOTE — Progress Notes (Cosign Needed Addendum)
[facility] Psych ED Progress Note  06/04/2021 6:00 PM Miguel Meadows  MRN:  [id number]   Subjective:  Patient presents to the emergency department for evaluation of depression and suicidal ideation.  Patient reports chronic depression and drug abuse.  He reports he has been more depressed since his wife died a year ago.  Patient reports that he plans to overdose if he does not get help. Today, patient was made IVC due to aggressive agitation this morning.  Patient  initial on arrival to the ER yesterday endorsed suicide and homicidal ideation.  Today he denied all those report stating he was in a dangerous place in [place] and to avoid being attacked he called EMS and reported feeling suicidal.  He admitted to excessive drinking and using substances since his wife passed on.  He is now in a stable relationship and want to go seek long term substance abuse treatment.  He was 8 years sober and clean after substance abuse treatment at [facility].  He plans to go back to same treatment Model.  He want to leave and settle upcoming court cases related to DUI before going to any Long term treatment facility.  He vehemently denied SI/HI/AVH and no paranoia.  Plan is to reevaluate in am and discharge.  His fiance, Lafonda Mosses is in agreement to discharge him in am and states that they have a plan for him to seek Long term substance abuse treatment after he clears his court cases. Principal Problem: <principal problem not specified> Diagnosis:  Active Problems:   * No active hospital problems. *   ED Assessment Time Calculation: Start Time: 1734 Stop Time: 1759 Total Time in Minutes (Assessment Completion): 25   Past Psychiatric History: Hx Depression, anxiety, Alcohol abuse, Cocaine abuse  [place] Scale:  Flowsheet Row ED from [facility]2021 [facility] from [facility]/30/2020 in [facility] ED from [facility]16/2020 [facility]  [facility]  C-SSRS RISK CATEGORY High Risk No Risk No Risk       Past Medical History:  Past Medical History:  Diagnosis Date   Anxiety    Depression     Past Surgical History:  Procedure Laterality Date   FRACTURE SURGERY     Family History: History reviewed. No pertinent family history. Family Psychiatric  History: unknown Social History:  Social History   Substance and Sexual Activity  Alcohol Use Not Currently   Comment: last used 08/2020     Social History   Substance and Sexual Activity  Drug Use Not Currently   Types: IV, Cocaine, Marijuana, Methamphetamines   Comment: last used 08/2020    Social History   Socioeconomic History   Marital status: Legally Separated    Spouse name: Not on file   Number of children: Not on file   Years of education: Not on file   Highest education level: Not on file  Occupational History   Not on file  Tobacco Use   Smoking status: Every Day    Packs/day: 0.50    Years: 30.00    Pack years: 15.00    Types: Cigarettes   Smokeless tobacco: Never  Vaping Use   Vaping Use: Every day   Start date: 09/30/2018   Substances: Nicotine, Flavoring  Substance and Sexual Activity   Alcohol use: Not Currently    Comment: last used 08/2020   Drug use: Not Currently    Types: IV, Cocaine, Marijuana, Methamphetamines    Comment: last  used 08/2020   Sexual activity: Yes    Birth control/protection: Condom  Other Topics Concern   Not on file  Social History Narrative   Not on file   Social Determinants of Health   Financial Resource Strain: Not on file  Food Insecurity: Not on file  Transportation Needs: Not on file  Physical Activity: Not on file  Stress: Not on file  Social Connections: Not on file    Sleep: Fair  Appetite:  Fair  Current Medications: Current Facility-Administered Medications  Medication Dose Route Frequency Provider Last Rate Last Admin   LORazepam (ATIVAN) injection 0-4 mg  0-4 mg Intravenous Q6H  Pollina, Canary Brim, MD       Or   LORazepam (ATIVAN) tablet 0-4 mg  0-4 mg Oral Q6H Pollina, Canary Brim, MD   1 mg at 06/03/21 1900   [START ON 06/05/2021] LORazepam (ATIVAN) injection 0-4 mg  0-4 mg Intravenous Q12H Pollina, Canary Brim, MD       Or   Melene Muller ON 06/05/2021] LORazepam (ATIVAN) tablet 0-4 mg  0-4 mg Oral Q12H Pollina, Canary Brim, MD       thiamine tablet 100 mg  100 mg Oral Daily Pollina, Canary Brim, MD   100 mg at 06/04/21 0945   Or   thiamine (B-1) injection 100 mg  100 mg Intravenous Daily Pollina, Canary Brim, MD       No current outpatient medications on file.    Lab Results:  Results for orders placed or performed during the hospital encounter of 06/02/21 (from the past 48 hour(s))  Comprehensive metabolic panel     Status: Abnormal   Collection Time: 06/03/21 12:05 AM  Result Value Ref Range   Sodium 137 135 - 145 mmol/L   Potassium 3.6 3.5 - 5.1 mmol/L   Chloride 101 98 - 111 mmol/L   CO2 26 22 - 32 mmol/L   Glucose, Bld 88 70 - 99 mg/dL    Comment: Glucose reference range applies only to samples taken after fasting for at least 8 hours.   BUN 11 6 - 20 mg/dL   Creatinine, Ser 3.66 0.61 - 1.24 mg/dL   Calcium 9.6 8.9 - 44.0 mg/dL   Total Protein 7.7 6.5 - 8.1 g/dL   Albumin 4.3 3.5 - 5.0 g/dL   AST 35 15 - 41 U/L   ALT 23 0 - 44 U/L   Alkaline Phosphatase 37 (L) 38 - 126 U/L   Total Bilirubin 1.1 0.3 - 1.2 mg/dL   GFR, Estimated >34 >74 mL/min    Comment: (NOTE) Calculated using the CKD-EPI Creatinine Equation (2021)    Anion gap 10 5 - 15    Comment: Performed at [facility], [address], [place], [place] [address]  Ethanol     Status: None   Collection Time: 06/03/21 12:05 AM  Result Value Ref Range   Alcohol, Ethyl (B) <10 <10 mg/dL    Comment: (NOTE) Lowest detectable limit for serum alcohol is 10 mg/dL.  For medical purposes only. Performed at [facility], [address] [place], [place] [address]   Salicylate level     Status: Abnormal   Collection Time: 06/03/21 12:05 AM  Result Value Ref Range   Salicylate Lvl <7.0 (L) 7.0 - 30.0 mg/dL    Comment: Performed at [facility], [address] [place], [place] [address]  Acetaminophen level     Status: Abnormal   Collection Time: 06/03/21 12:05  AM  Result Value Ref Range   Acetaminophen (Tylenol), Serum <10 (L) 10 - 30 ug/mL    Comment: (NOTE) Therapeutic concentrations vary significantly. A range of 10-30 ug/mL  may be an effective concentration for many patients. However, some  are best treated at concentrations outside of this range. Acetaminophen concentrations >150 ug/mL at 4 hours after ingestion  and >50 ug/mL at 12 hours after ingestion are often associated with  toxic reactions.  Performed at [facility], [address], [place], [place] [address]   cbc     Status: None   Collection Time: 06/03/21 12:05 AM  Result Value Ref Range   WBC 8.6 4.0 - 10.5 K/uL   RBC 4.80 4.22 - 5.81 MIL/uL   Hemoglobin 15.2 13.0 - 17.0 g/dL   HCT 63.7 85.8 - 85.0 %   MCV 91.5 80.0 - 100.0 fL   MCH 31.7 26.0 - 34.0 pg   MCHC 34.6 30.0 - 36.0 g/dL   RDW 27.7 41.2 - 87.8 %   Platelets 298 150 - 400 K/uL   nRBC 0.0 0.0 - 0.2 %    Comment: Performed at [place], [address], [place], [place] [address]  Resp Panel by RT-PCR (Flu A&B, Covid) Anterior Nasal Swab     Status: None   Collection Time: 06/03/21 12:09 AM   Specimen: Anterior Nasal Swab  Result Value Ref Range   SARS Coronavirus 2 by RT PCR NEGATIVE NEGATIVE    Comment: (NOTE) SARS-CoV-2 target nucleic acids are NOT DETECTED.  The SARS-CoV-2 RNA is generally detectable in upper respiratory specimens during the acute phase of infection. The lowest concentration of SARS-CoV-2 viral copies this assay can detect is 138 copies/mL. A negative result does not preclude SARS-Cov-2 infection  and should not be used as the sole basis for treatment or other patient management decisions. A negative result may occur with  improper specimen collection/handling, submission of specimen other than nasopharyngeal swab, presence of viral mutation(s) within the areas targeted by this assay, and inadequate number of viral copies(<138 copies/mL). A negative result must be combined with clinical observations, patient history, and epidemiological information. The expected result is Negative.  Fact Sheet for Patients:  BloggerCourse.com  Fact Sheet for Healthcare Providers:  SeriousBroker.it  This test is no t yet approved or cleared by the [place] FDA and  has been authorized for detection and/or diagnosis of SARS-CoV-2 by FDA under an Emergency Use Authorization (EUA). This EUA will remain  in effect (meaning this test can be used) for the duration of the COVID-19 declaration under Section 564(b)(1) of the Act, 21 U.S.C.section 360bbb-3(b)(1), unless the authorization is terminated  or revoked sooner.       Influenza A by PCR NEGATIVE NEGATIVE   Influenza B by PCR NEGATIVE NEGATIVE    Comment: (NOTE) The Xpert Xpress SARS-CoV-2/FLU/RSV plus assay is intended as an aid in the diagnosis of influenza from Nasopharyngeal swab specimens and should not be used as a sole basis for treatment. Nasal washings and aspirates are unacceptable for Xpert Xpress SARS-CoV-2/FLU/RSV testing.  Fact Sheet for Patients: BloggerCourse.com  Fact Sheet for Healthcare Providers: SeriousBroker.it  This test is not yet approved or cleared by the [place] FDA and has been authorized for detection and/or diagnosis of SARS-CoV-2 by FDA under an Emergency Use Authorization (EUA). This EUA will remain in effect (meaning this test can be used) for the duration of the COVID-19 declaration under  Section 564(b)(1) of the  Act, 21 U.S.C. section 360bbb-3(b)(1), unless the authorization is terminated or revoked.  Performed at [facility], [address] [place], [place] [address]   Rapid urine drug screen (hospital performed)     Status: Abnormal   Collection Time: 06/03/21  3:47 AM  Result Value Ref Range   Opiates NONE DETECTED NONE DETECTED   Cocaine POSITIVE (A) NONE DETECTED   Benzodiazepines NONE DETECTED NONE DETECTED   Amphetamines NONE DETECTED NONE DETECTED   Tetrahydrocannabinol NONE DETECTED NONE DETECTED   Barbiturates NONE DETECTED NONE DETECTED    Comment: (NOTE) DRUG SCREEN FOR MEDICAL PURPOSES ONLY.  IF CONFIRMATION IS NEEDED FOR ANY PURPOSE, NOTIFY LAB WITHIN 5 DAYS.  LOWEST DETECTABLE LIMITS FOR URINE DRUG SCREEN Drug Class                     Cutoff (ng/mL) Amphetamine and metabolites    1000 Barbiturate and metabolites    200 Benzodiazepine                 200 Tricyclics and metabolites     300 Opiates and metabolites        300 Cocaine and metabolites        300 THC                            50 Performed at [facility], [address] [place], [place] [address]   Urinalysis, Routine w reflex microscopic Urine, Clean Catch     Status: Abnormal   Collection Time: 06/03/21  3:47 AM  Result Value Ref Range   Color, Urine YELLOW YELLOW   APPearance CLEAR CLEAR   Specific Gravity, Urine 1.006 1.005 - 1.030   pH 6.0 5.0 - 8.0   Glucose, UA NEGATIVE NEGATIVE mg/dL   Hgb urine dipstick MODERATE (A) NEGATIVE   Bilirubin Urine NEGATIVE NEGATIVE   Ketones, ur NEGATIVE NEGATIVE mg/dL   Protein, ur NEGATIVE NEGATIVE mg/dL   Nitrite NEGATIVE NEGATIVE   Leukocytes,Ua LARGE (A) NEGATIVE   RBC / HPF 0-5 0 - 5 RBC/hpf   WBC, UA >50 (H) 0 - 5 WBC/hpf   Bacteria, UA NONE SEEN NONE SEEN   Squamous Epithelial / LPF 0-5 0 - 5   WBC Clumps PRESENT     Comment: Performed at [facility], [address] [place], [place] [address]    Blood Alcohol level:  Lab Results  Component Value Date   ETH <10 06/03/2021   ETH <10 09/17/2020    Physical Findings:  CIWA:  CIWA-Ar Total: 2 COWS:     Musculoskeletal: Strength & Muscle Tone: within normal limits Gait & Station: normal Patient leans: Front  Psychiatric Specialty Exam:  Presentation  General Appearance: Appropriate for Environment; Disheveled; Neat  Eye Contact:Good  Speech:Clear and Coherent; Normal Rate  Speech Volume:Normal  Handedness:Right  Mood and Affect  Mood:Depressed  Affect:Congruent   Thought Process  Thought Processes:Coherent; Goal Directed; Linear  Descriptions of Associations:Intact  Orientation:Full (Time, Place and Person)  Thought Content:Logical  History of Schizophrenia/Schizoaffective disorder:No  Duration of Psychotic Symptoms:No data recorded Hallucinations:Hallucinations: None  Ideas of Reference:None  Suicidal Thoughts:Suicidal Thoughts: No  Homicidal Thoughts:Homicidal Thoughts: No   Sensorium  Memory:Immediate Good; Recent Good; Remote Good  Judgment:Good  Insight:Good   Executive Functions  Concentration:Good  Attention Span:Good  Recall:Good  Fund of Knowledge:Good  Language:Good   Psychomotor Activity  Psychomotor Activity:Psychomotor Activity: Normal  Assets  Assets:Communication Skills; Desire for Improvement; Housing; Intimacy; Leisure Time   Sleep  Sleep:Sleep: Fair   Physical Exam: Physical Exam Constitutional:      Appearance: Normal appearance.  HENT:     Head: Normocephalic and atraumatic.     Nose: Nose normal.  Cardiovascular:     Rate and Rhythm: Normal rate and regular rhythm.     Pulses: Normal pulses.  Pulmonary:     Effort: Pulmonary effort is normal.  Musculoskeletal:        General: Normal range of motion.     Cervical back: Normal range of motion.  Skin:    General: Skin is warm and dry.  Neurological:      General: No focal deficit present.     Mental Status: He is alert and oriented to person, place, and time.   Review of Systems  Constitutional: Negative.   HENT: Negative.    Eyes: Negative.   Respiratory: Negative.    Cardiovascular: Negative.   Gastrointestinal: Negative.   Genitourinary: Negative.   Musculoskeletal: Negative.   Skin: Negative.   Neurological: Negative.   Endo/Heme/Allergies: Negative.   Psychiatric/Behavioral:  Positive for substance abuse. The patient is nervous/anxious.   Blood pressure 110/72, pulse 87, temperature 98.8 F (37.1 C), temperature source Oral, resp. rate 15, height 5\' 7"  (1.702 m), weight 71.2 kg, SpO2 99 %. Body mass index is 24.59 kg/m.   Medical Decision Making: Patient is not having Alcohol withdrawal symptoms.  He and fiance have a plan for patient to engage in long term substance abuse treatment.  He plans to clear some court cases and then go for treatment.  We will discharge in am.  Problem 1: Recurrent Major Depressive disorder, moderate without Psychotic features  Problem 2: Polysubstance abuse  , NP-PMHNP-BC 06/04/2021, 6:00 PM

## 2021-06-04 NOTE — ED Notes (Signed)
Pt requesting to speak to doctor, provider notified.

## 2021-06-05 ENCOUNTER — Other Ambulatory Visit (HOSPITAL_COMMUNITY): Payer: Self-pay

## 2021-06-05 MED ORDER — TRAZODONE HCL 100 MG PO TABS
100.0000 mg | ORAL_TABLET | Freq: Every day | ORAL | 0 refills | Status: AC
  Filled 2021-06-05: qty 15, 15d supply, fill #0

## 2021-06-05 NOTE — Discharge Instructions (Signed)
To help you maintain a sober lifestyle, a substance use disorder treatment program may be beneficial to you.  Contact one of the following providers at your earliest opportunity to ask about enrolling in their program:   RESIDENTIAL TREATMENT PROGRAMS:       [facility]      [address]       [place], [place] [address]       [phone]         [facility] [place] [address]       [phone]         [facility]      [address]       [place], [place] [address]       [phone]   [place]:         [place]       [place] [place] [address]       [phone]         [place] [address], [place] [address]       [phone] II       [address]       [place], [place] [address]       [phone], [place] [address]       [phone]         [facility]      [address] [place],  [address]       [phone]

## 2021-06-05 NOTE — ED Provider Notes (Signed)
Cleared by psychiatry and discharged.   Virgina Norfolk, DO 06/05/21 [phone]

## 2021-06-05 NOTE — ED Provider Notes (Signed)
Emergency Medicine Observation Re-evaluation Note  Miguel Meadows is a 51 y.o. male, seen on rounds today.  Pt initially presented to the ED for complaints of Suicidal Currently, the patient is resting.  Physical Exam  BP 112/85 (BP Location: Left Arm)   Pulse 69   Temp 97.9 F (36.6 C) (Oral)   Resp 18   Ht 5\' 7"  (1.702 m)   Wt 71.2 kg   SpO2 98%   BMI 24.59 kg/m  Physical Exam HENT:     Head: Normocephalic.  Neurological:     Mental Status: He is alert.  Psychiatric:        Mood and Affect: Mood normal.    ED Course / MDM  EKG:EKG Interpretation  Date/Time:  Sunday Jun 03 2021 03:43:44 EDT Ventricular Rate:  68 PR Interval:  118 QRS Duration: 90 QT Interval:  406 QTC Calculation: 432 R Axis:   84 Text Interpretation: Sinus rhythm Atrial premature complexes Borderline short PR interval Confirmed by Orpah Greek [phone]) on 06/03/2021 3:48:14 AM  I have reviewed the labs performed to date as well as medications administered while in observation.  Recent changes in the last 24 hours include nothing.  Plan  Current plan is for per psych not, possible d/c this AM.  Lynnette Caffey is not under involuntary commitment.     Lennice Sites, DO 06/05/21 [phone]

## 2021-06-05 NOTE — BH Assessment (Addendum)
[facility] Assessment Progress Note   Per Dahlia Byes, NP, this pt does not require psychiatric hospitalization at this time.  Pt presents under IVC initiated by EDP Gloris Manchester, MD which has been rescinded by Nelly Rout, MD.  Pt is psychiatrically cleared.  Discharge instructions include referral information for several residential substance use disorder treatment providers.  EDP Virgina Norfolk, DO and pt's nurses, Danielle and I-Li, have been notified.  Doylene Canning, MA Triage Specialist [phone]

## 2021-06-05 NOTE — Discharge Summary (Addendum)
[facility] Psych ED Discharge  06/05/2021 9:43 AM Miguel Meadows  MRN:  [id number]  Principal Problem: <principal problem not specified> Discharge Diagnoses: Active Problems:   Cocaine abuse with cocaine-induced mood disorder [employer])  Clinical Impression:  Final diagnoses:  Suicidal ideation   Subjective: Patient presents to the emergency department for evaluation of depression and suicidal ideation.  Patient reports chronic depression and drug abuse.  He reports he has been more depressed since his wife died a year ago.  Patient reports that he plans to overdose if he does not get help. Patient spent a calm night.  No agitation or concerning behavior.  Fiance is in agreement for him to be discharged so they can pursue long term inpatient substance abuse treatment.  Patient denies SI/HI/.AVH and no mention of paranoia.  Patient is discharged.  ED Assessment Time Calculation: Start Time: 0925 Stop Time: 0942 Total Time in Minutes (Assessment Completion): 17   Past Psychiatric History: See initial Psych Consult note  Past Medical History:  Past Medical History:  Diagnosis Date   Anxiety    Depression     Past Surgical History:  Procedure Laterality Date   FRACTURE SURGERY     Family History: History reviewed. No pertinent family history. Family Psychiatric  History: see initial Psych consult note Social History:  Social History   Substance and Sexual Activity  Alcohol Use Not Currently   Comment: last used 08/2020     Social History   Substance and Sexual Activity  Drug Use Not Currently   Types: IV, Cocaine, Marijuana, Methamphetamines   Comment: last used 08/2020    Social History   Socioeconomic History   Marital status: Legally Separated    Spouse name: Not on file   Number of children: Not on file   Years of education: Not on file   Highest education level: Not on file  Occupational History   Not on file  Tobacco Use   Smoking status: Every Day    Packs/day:  0.50    Years: 30.00    Pack years: 15.00    Types: Cigarettes   Smokeless tobacco: Never  Vaping Use   Vaping Use: Every day   Start date: 09/30/2018   Substances: Nicotine, Flavoring  Substance and Sexual Activity   Alcohol use: Not Currently    Comment: last used 08/2020   Drug use: Not Currently    Types: IV, Cocaine, Marijuana, Methamphetamines    Comment: last used 08/2020   Sexual activity: Yes    Birth control/protection: Condom  Other Topics Concern   Not on file  Social History Narrative   Not on file   Social Determinants of Health   Financial Resource Strain: Not on file  Food Insecurity: Not on file  Transportation Needs: Not on file  Physical Activity: Not on file  Stress: Not on file  Social Connections: Not on file    Tobacco Cessation:  N/A, patient does not currently use tobacco products  Current Medications: Current Facility-Administered Medications  Medication Dose Route Frequency Provider Last Rate Last Admin   LORazepam (ATIVAN) injection 0-4 mg  0-4 mg Intravenous Q12H Pollina, Canary Brim, MD       Or   LORazepam (ATIVAN) tablet 0-4 mg  0-4 mg Oral Q12H Pollina, Canary Brim, MD       thiamine tablet 100 mg  100 mg Oral Daily Gilda Crease, MD   100 mg at 06/04/21 0945   Or   thiamine (B-1) injection 100  mg  100 mg Intravenous Daily Pollina, Canary Brim, MD       traZODone (DESYREL) tablet 100 mg  100 mg Oral QHS Terrilee Files, MD   100 mg at 06/04/21 2016   Current Outpatient Medications  Medication Sig Dispense Refill   traZODone (DESYREL) 100 MG tablet Take 1 tablet (100 mg total) by mouth at bedtime for 15 days. 15 tablet 0   PTA Medications: (Not in a hospital admission)   [place] Scale:  Flowsheet Row ED from 06/02/2021 in [facility] from 11/30/2020 in [facility] EM[facility]GENCY [facility]PART[facility] ED from 10/16/2020 in [facility]  C-SSRS RISK CATEGORY  High Risk No Risk No Risk       Musculoskeletal: Strength & Muscle Tone: within normal limits Gait & Station: normal Patient leans: Front  Psychiatric Specialty Exam: Presentation  General Appearance: Fairly Groomed; Appropriate for Environment  Eye Contact:Good  Speech:Clear and Coherent; Normal Rate  Speech Volume:Normal  Handedness:Right   Mood and Affect  Mood:Euthymic  Affect:Congruent   Thought Process  Thought Processes:Coherent; Goal Directed; Linear  Descriptions of Associations:Intact  Orientation:Full (Time, Place and Person)  Thought Content:Logical  History of Schizophrenia/Schizoaffective disorder:No  Duration of Psychotic Symptoms:No data recorded Hallucinations:Hallucinations: None  Ideas of Reference:None  Suicidal Thoughts:Suicidal Thoughts: No  Homicidal Thoughts:Homicidal Thoughts: No   Sensorium  Memory:Immediate Good; Recent Good; Remote Good  Judgment:Good  Insight:Good   Executive Functions  Concentration:Good  Attention Span:Good  Recall:Good  Fund of Knowledge:Good  Language:Good   Psychomotor Activity  Psychomotor Activity:Psychomotor Activity: Normal   Assets  Assets:Communication Skills; Desire for Improvement; Housing; Health and safety inspector; Intimacy   Sleep  Sleep:Sleep: Good    Physical Exam: Physical Exam ROS Blood pressure 112/85, pulse 69, temperature 97.9 F (36.6 C), temperature source Oral, resp. rate 18, height 5\' 7"  (1.702 m), weight 71.2 kg, SpO2 98 %. Body mass index is 24.59 kg/m.   Demographic Factors:  Male and Divorced or widowed  Loss Factors: Loss of significant relationship, Legal issues, and DUI Issues  Historical Factors: Family history of mental illness or substance abuse  Risk Reduction Factors:   Employed, Living with another person, especially a relative, Positive therapeutic relationship, and lives with fiance.  Continued Clinical Symptoms:   Alcohol/Substance Abuse/Dependencies  Cognitive Features That Contribute To Risk:  None    Suicide Risk:  Minimal: No identifiable suicidal ideation.  Patients presenting with no risk factors but with morbid ruminations; may be classified as minimal risk based on the severity of the depressive symptoms    Plan Of Care/Follow-up recommendations:  Activity:  as tolerated Diet:  Regular  Medical Decision Making: Discharge home with referral to substance abuse facilities.  Problem 1: Cocaine Mood induced mood disorder  Problem 2: Alcohol abuse Disposition: Discharge  , NP-PMHNP-BC 06/05/2021, 9:43 AM

## 2021-06-13 ENCOUNTER — Other Ambulatory Visit (HOSPITAL_COMMUNITY): Payer: Self-pay

## 2022-04-05 IMAGING — CT CT HEAD W/O CM
3 series · 14 of 47 positions shown, 16 images · non-contrast
Comparison: August 12, 2003

CLINICAL DATA: Syncopal episode.

EXAM:
CT HEAD WITHOUT CONTRAST
TECHNIQUE: Contiguous axial images were obtained from the base of the skull
through the vertex without intravenous contrast.

[Series 3: head wo · axial · 0.42mm/px · z∈[-152,-27]mm · 8 of 31 slices shown, 10 images]
[im 3/31  brain]
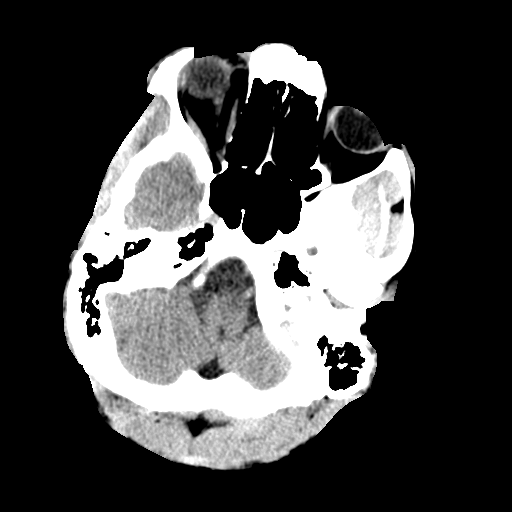
[im 3/31  bone]
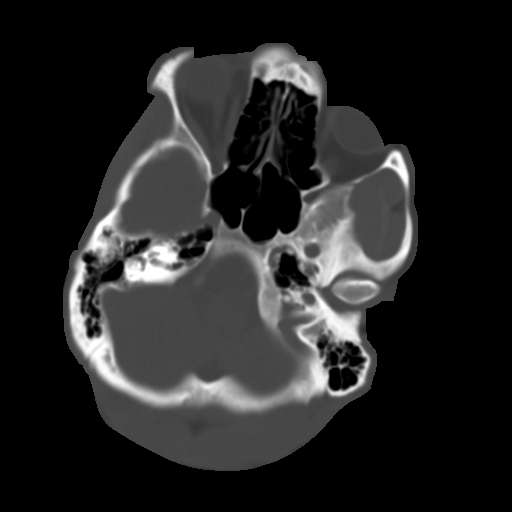
[im 7/31  brain]
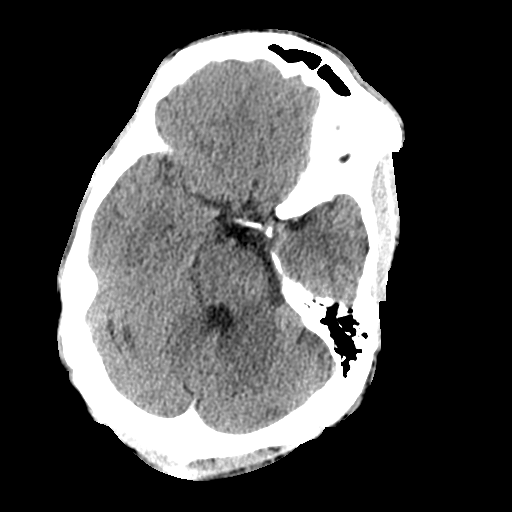
[im 10/31  brain]
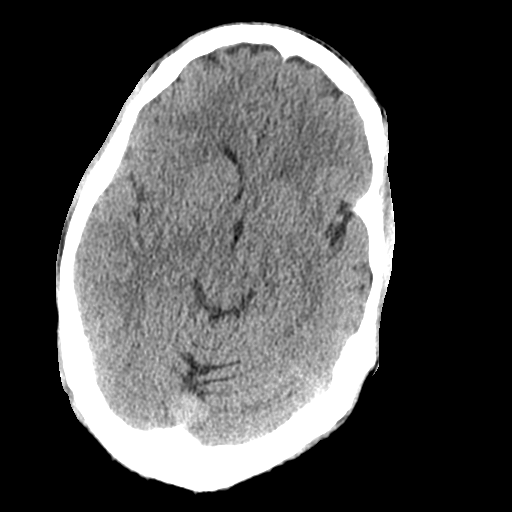
[im 14/31  brain]
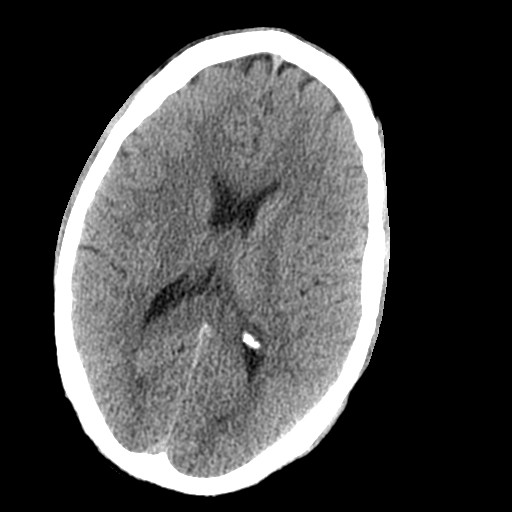
[im 17/31  brain]
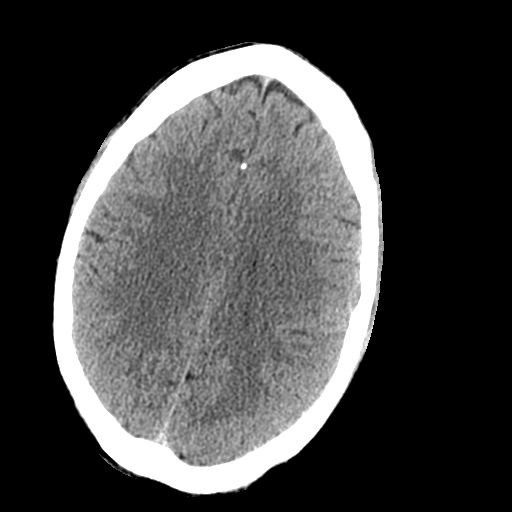
[im 17/31  bone]
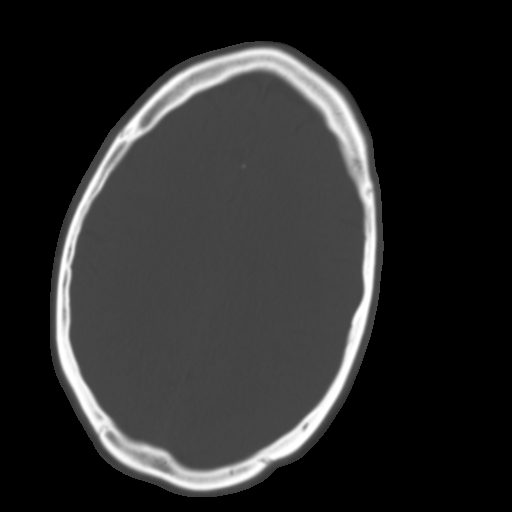
[im 21/31  brain]
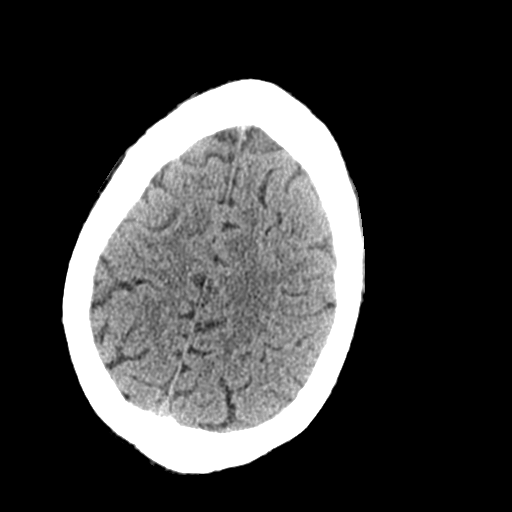
[im 24/31  brain]
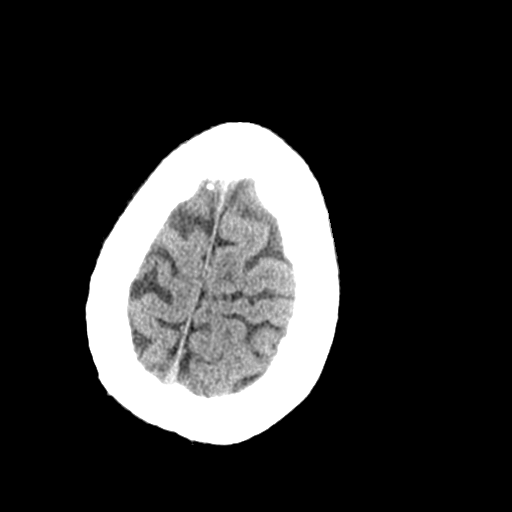
[im 28/31  brain]
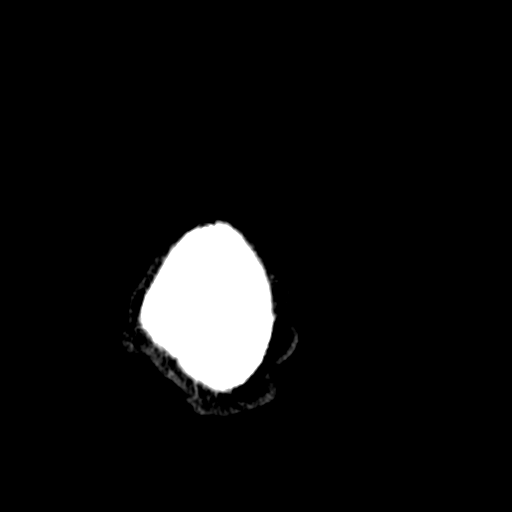

[Series 4: coronal soft tissue · coronal · 0.30mm/px · 3 of 74 slices shown]
[im 25/74  brain]
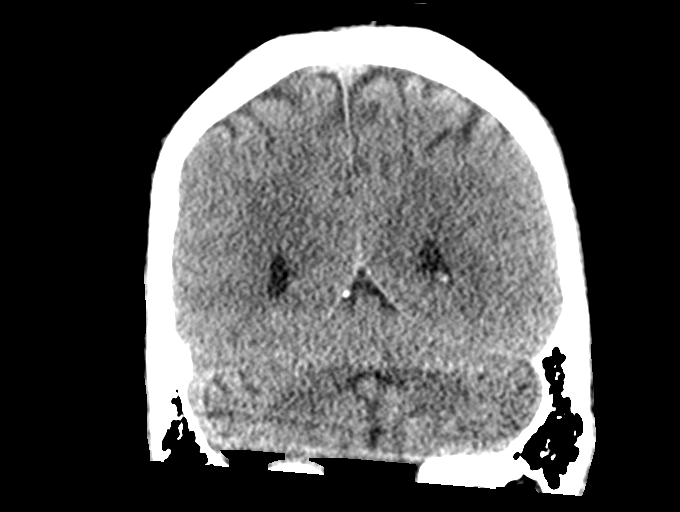
[im 33/74  brain]
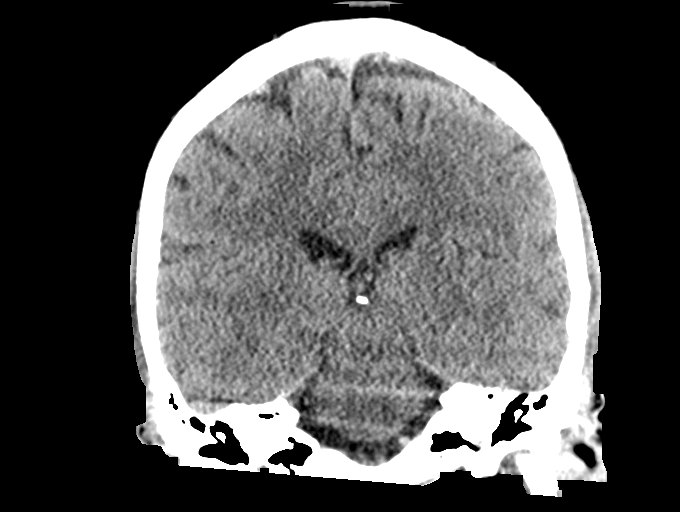
[im 41/74  brain]
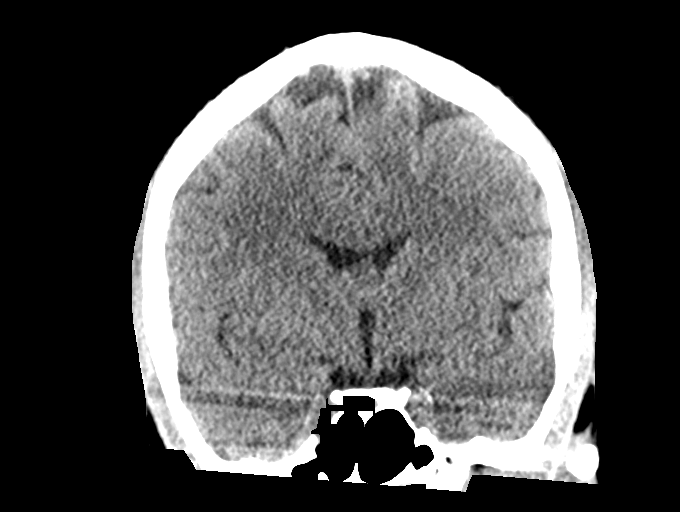

[Series 5: sagittal soft tissue · sagittal · 0.30mm/px · 3 of 52 slices shown]
[im 18/52  brain]
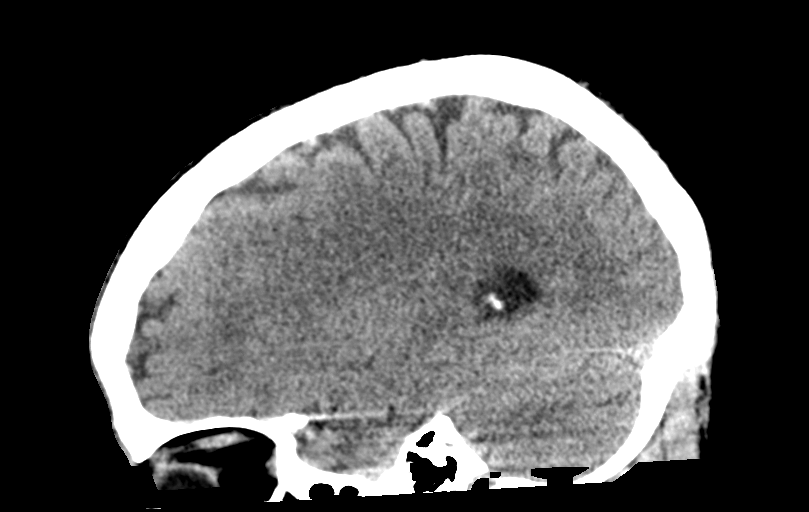
[im 26/52  brain]
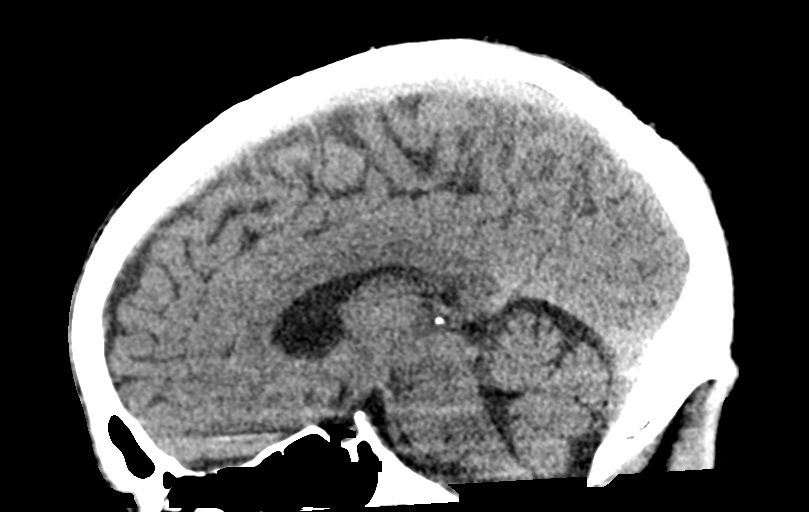
[im 35/52  brain]
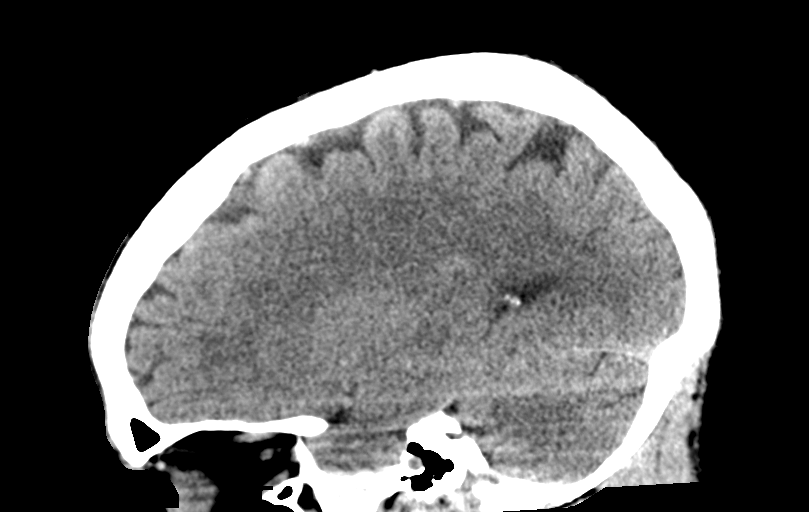

[14 of 47 positions shown; findings below may reference images not displayed]

FINDINGS: Brain: No evidence of acute infarction, hemorrhage, hydrocephalus,
extra-axial collection or mass lesion/mass effect.

Vascular: No hyperdense vessel or unexpected calcification.

Skull: Normal. Negative for fracture or focal lesion.

Sinuses/Orbits: No acute finding.

Other: None.
IMPRESSION: No acute intracranial abnormality.

## 2022-04-15 ENCOUNTER — Other Ambulatory Visit: Payer: Self-pay

## 2022-04-15 ENCOUNTER — Emergency Department: Payer: Managed Care, Other (non HMO)

## 2022-04-15 ENCOUNTER — Emergency Department
Admission: EM | Admit: 2022-04-15 | Discharge: 2022-04-15 | Disposition: A | Discharge status: Home / Self Care | Payer: Managed Care, Other (non HMO) | Attending: Emergency Medicine | Admitting: Emergency Medicine

## 2022-04-15 DIAGNOSIS — R0602 Shortness of breath: Secondary | ICD-10-CM | POA: Insufficient documentation

## 2022-04-15 LAB — CBC
HCT: 43.1 % (ref 39.0–52.0)
Hemoglobin: 14.2 g/dL (ref 13.0–17.0)
MCH: 30.1 pg (ref 26.0–34.0)
MCHC: 32.9 g/dL (ref 30.0–36.0)
MCV: 91.3 fL (ref 80.0–100.0)
Platelets: 319 10*3/uL (ref 150–400)
RBC: 4.72 MIL/uL (ref 4.22–5.81)
RDW: 12.8 % (ref 11.5–15.5)
WBC: 7.7 10*3/uL (ref 4.0–10.5)
nRBC: 0 % (ref 0.0–0.2)

## 2022-04-15 LAB — COMPREHENSIVE METABOLIC PANEL
ALT: 40 U/L (ref 0–44)
AST: 69 U/L — ABNORMAL HIGH (ref 15–41)
Albumin: 4.3 g/dL (ref 3.5–5.0)
Alkaline Phosphatase: 37 U/L — ABNORMAL LOW (ref 38–126)
Anion gap: 12 (ref 5–15)
BUN: 8 mg/dL (ref 6–20)
CO2: 21 mmol/L — ABNORMAL LOW (ref 22–32)
Calcium: 9.2 mg/dL (ref 8.9–10.3)
Chloride: 100 mmol/L (ref 98–111)
Creatinine, Ser: 1.04 mg/dL (ref 0.61–1.24)
GFR, Estimated: >60 mL/min (ref >60)
Glucose, Bld: 110 mg/dL — ABNORMAL HIGH (ref 70–99)
Potassium: 3.9 mmol/L (ref 3.5–5.1)
Sodium: 133 mmol/L — ABNORMAL LOW (ref 135–145)
Total Bilirubin: 1.2 mg/dL (ref 0.3–1.2)
Total Protein: 7.7 g/dL (ref 6.5–8.1)

## 2022-04-15 LAB — TROPONIN I (HIGH SENSITIVITY): Troponin I (High Sensitivity): 4 ng/L (ref <18)

## 2022-04-15 NOTE — ED Provider Notes (Signed)
[facility] Provider Note  Patient Contact: 4:26 PM (approximate)   History   Shortness of Breath   HPI  Miguel Meadows is a 52 y.o. male with an below past medical history, presents to the emergency department with occasional shortness of breath.  Patient reports that he had some chest pain last night that has since resolved.  Patient is currently in custody of police.  Patient denies chest tightness or wheezing.  No nausea, vomiting or abdominal pain.      Physical Exam   Triage Vital Signs: ED Triage Vitals  Enc Vitals Group     BP 04/15/22 1531 (!) 136/95     Pulse Rate 04/15/22 1531 79     Resp 04/15/22 1531 18     Temp 04/15/22 1531 98 F (36.7 C)     Temp src --      SpO2 04/15/22 1531 98 %     Weight --      Height 04/15/22 1530 5\' 7"  (1.702 m)     Head Circumference --      Peak Flow --      Pain Score 04/15/22 1530 0     Pain Loc --      Pain Edu? --      Excl. in GC? --     Most recent vital signs: Vitals:   04/15/22 1531  BP: (!) 136/95  Pulse: 79  Resp: 18  Temp: 98 F (36.7 C)  SpO2: 98%     General: Alert and in no acute distress. Eyes:  PERRL. EOMI. Head: No acute traumatic findings ENT:      Nose: No congestion/rhinnorhea.      Mouth/Throat: Mucous membranes are moist.  Neck: No stridor. No cervical spine tenderness to palpation. Hematological/Lymphatic/Immunilogical: No cervical lymphadenopathy. Cardiovascular:  Good peripheral perfusion Respiratory: Normal respiratory effort without tachypnea or retractions. Lungs CTAB. Good air entry to the bases with no decreased or absent breath sounds. Gastrointestinal: Bowel sounds 4 quadrants. Soft and nontender to palpation. No guarding or rigidity. No palpable masses. No distention. No CVA tenderness. Musculoskeletal: Full range of motion to all extremities.  Neurologic:  No gross focal neurologic deficits are appreciated.  Skin:   No rash noted    ED Results  / Procedures / Treatments   Labs (all labs ordered are listed, but only abnormal results are displayed) Labs Reviewed  COMPREHENSIVE METABOLIC PANEL - Abnormal; Notable for the following components:      Result Value   Sodium 133 (*)    CO2 21 (*)    Glucose, Bld 110 (*)    AST 69 (*)    Alkaline Phosphatase 37 (*)    All other components within normal limits  CBC  TROPONIN I (HIGH SENSITIVITY)        RADIOLOGY  I personally viewed and evaluated these images as part of my medical decision making, as well as reviewing the written report by the radiologist.  ED Provider Interpretation: No acute abnormality on chest x-ray   PROCEDURES:  Critical Care performed: No  Procedures   MEDICATIONS ORDERED IN ED: Medications - No data to display   IMPRESSION / MDM / ASSESSMENT AND PLAN / ED COURSE  I reviewed the triage vital signs and the nursing notes.                              Assessment and plan:   Shortness  of breath 52 year old male presents to the emergency department with occasional shortness of breath.  Vital signs are reassuring at triage.  On exam, patient was alert, active and nontoxic-appearing.  EKG indicates normal sinus rhythm without ST segment elevation or other apparent arrhythmia.  CBC and CMP largely reassuring.  Chest x-ray unremarkable.  Patient was resting on exam but was easily arousable.  Patient is appropriate for discharge.   FINAL CLINICAL IMPRESSION(S) / ED DIAGNOSES   Final diagnoses:  Shortness of breath     Rx / DC Orders   ED Discharge Orders     None        Note:  This document was prepared using Dragon voice recognition software and may include unintentional dictation errors.   Pia Mau [facility], PA-C 04/15/22 2035    Corena Herter, MD 04/16/22 [phone]

## 2022-04-15 NOTE — ED Triage Notes (Signed)
Pt to ED via POV for medical clearance for jail. Pt reports he passed out last nigh and started having CP. Pt denies CP currently but states does feel SOB.
